# Patient Record
Sex: Male | Born: 1990 | Race: White | Hispanic: No | Marital: Single | Smoking: Current every day smoker
Health system: Southern US, Community
[De-identification: ages and names within clinical notes are randomized; demographics above are authoritative.]

## PROBLEM LIST (undated history)

## (undated) DIAGNOSIS — F419 Anxiety disorder, unspecified: Secondary | ICD-10-CM

## (undated) DIAGNOSIS — F329 Major depressive disorder, single episode, unspecified: Secondary | ICD-10-CM

## (undated) DIAGNOSIS — F32A Depression, unspecified: Secondary | ICD-10-CM

## (undated) DIAGNOSIS — N289 Disorder of kidney and ureter, unspecified: Secondary | ICD-10-CM

## (undated) DIAGNOSIS — Z8614 Personal history of Methicillin resistant Staphylococcus aureus infection: Secondary | ICD-10-CM

## (undated) DIAGNOSIS — N200 Calculus of kidney: Secondary | ICD-10-CM

## (undated) DIAGNOSIS — K219 Gastro-esophageal reflux disease without esophagitis: Secondary | ICD-10-CM

## (undated) HISTORY — DX: Anxiety disorder, unspecified: F41.9

## (undated) HISTORY — DX: Major depressive disorder, single episode, unspecified: F32.9

## (undated) HISTORY — PX: PILONIDAL CYST DRAINAGE: SHX743

## (undated) HISTORY — PX: OTHER SURGICAL HISTORY: SHX169

## (undated) HISTORY — DX: Depression, unspecified: F32.A

## (undated) HISTORY — DX: Calculus of kidney: N20.0

## (undated) SURGERY — Surgical Case
Anesthesia: *Unknown

---
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
## 2010-06-14 ENCOUNTER — Inpatient Hospital Stay: Payer: Self-pay | Admitting: Psychiatry

## 2011-05-26 ENCOUNTER — Emergency Department: Payer: Self-pay | Admitting: Emergency Medicine

## 2011-05-26 LAB — COMPREHENSIVE METABOLIC PANEL
Anion Gap: 7 (ref 7–16)
Calcium, Total: 8.8 mg/dL (ref 8.5–10.1)
Chloride: 108 mmol/L — ABNORMAL HIGH (ref 98–107)
Co2: 29 mmol/L (ref 21–32)
EGFR (African American): >60
Glucose: 94 mg/dL (ref 65–99)
Potassium: 4.1 mmol/L (ref 3.5–5.1)
SGOT(AST): 12 U/L — ABNORMAL LOW (ref 15–37)
Sodium: 144 mmol/L (ref 136–145)

## 2011-05-26 LAB — URINALYSIS, COMPLETE
RBC,UR: 3718 /HPF (ref 0–5)
Squamous Epithelial: NONE SEEN

## 2011-05-26 LAB — CBC
MCH: 30.1 pg (ref 26.0–34.0)
Platelet: 189 10*3/uL (ref 150–440)
RBC: 4.96 10*6/uL (ref 4.40–5.90)

## 2011-05-26 LAB — LIPASE, BLOOD: Lipase: 51 U/L — ABNORMAL LOW (ref 73–393)

## 2013-09-24 ENCOUNTER — Emergency Department: Payer: Self-pay | Admitting: Emergency Medicine

## 2013-09-24 LAB — COMPREHENSIVE METABOLIC PANEL
ALBUMIN: 4.2 g/dL (ref 3.4–5.0)
ALK PHOS: 60 U/L
ALT: 32 U/L (ref 12–78)
ANION GAP: 8 (ref 7–16)
AST: 16 U/L (ref 15–37)
BUN: 11 mg/dL (ref 7–18)
Bilirubin,Total: 0.4 mg/dL (ref 0.2–1.0)
CHLORIDE: 106 mmol/L (ref 98–107)
Calcium, Total: 9.3 mg/dL (ref 8.5–10.1)
Co2: 25 mmol/L (ref 21–32)
Creatinine: 0.84 mg/dL (ref 0.60–1.30)
EGFR (African American): >60
Glucose: 95 mg/dL (ref 65–99)
Osmolality: 277 (ref 275–301)
Potassium: 3.9 mmol/L (ref 3.5–5.1)
Sodium: 139 mmol/L (ref 136–145)
TOTAL PROTEIN: 7.9 g/dL (ref 6.4–8.2)

## 2013-09-24 LAB — CBC
HCT: 46.4 % (ref 40.0–52.0)
HGB: 15.5 g/dL (ref 13.0–18.0)
MCH: 28.8 pg (ref 26.0–34.0)
MCHC: 33.3 g/dL (ref 32.0–36.0)
MCV: 87 fL (ref 80–100)
Platelet: 245 10*3/uL (ref 150–440)
RBC: 5.36 10*6/uL (ref 4.40–5.90)
RDW: 12.6 % (ref 11.5–14.5)
WBC: 10.8 10*3/uL — ABNORMAL HIGH (ref 3.8–10.6)

## 2013-09-24 LAB — URINALYSIS, COMPLETE
Bacteria: NONE SEEN
Bilirubin,UR: NEGATIVE
Blood: NEGATIVE
Glucose,UR: NEGATIVE mg/dL (ref 0–75)
LEUKOCYTE ESTERASE: NEGATIVE
NITRITE: NEGATIVE
PH: 5 (ref 4.5–8.0)
PROTEIN: NEGATIVE
RBC,UR: NONE SEEN /HPF (ref 0–5)
SQUAMOUS EPITHELIAL: NONE SEEN
Specific Gravity: 1.026 (ref 1.003–1.030)

## 2015-10-28 ENCOUNTER — Emergency Department
Admission: EM | Admit: 2015-10-28 | Discharge: 2015-10-28 | Disposition: A | Discharge status: Home / Self Care | Payer: 59 | Attending: Emergency Medicine | Admitting: Emergency Medicine

## 2015-10-28 ENCOUNTER — Emergency Department: Payer: 59

## 2015-10-28 ENCOUNTER — Encounter: Payer: Self-pay | Admitting: *Deleted

## 2015-10-28 DIAGNOSIS — F17200 Nicotine dependence, unspecified, uncomplicated: Secondary | ICD-10-CM | POA: Insufficient documentation

## 2015-10-28 DIAGNOSIS — R109 Unspecified abdominal pain: Secondary | ICD-10-CM

## 2015-10-28 DIAGNOSIS — N200 Calculus of kidney: Secondary | ICD-10-CM | POA: Diagnosis not present

## 2015-10-28 DIAGNOSIS — R10A0 Flank pain, unspecified side: Secondary | ICD-10-CM

## 2015-10-28 LAB — URINALYSIS COMPLETE WITH MICROSCOPIC (ARMC ONLY)
BACTERIA UA: NONE SEEN
BILIRUBIN URINE: NEGATIVE
Glucose, UA: NEGATIVE mg/dL
LEUKOCYTES UA: NEGATIVE
Nitrite: NEGATIVE
PH: 6 (ref 5.0–8.0)
Protein, ur: 100 mg/dL — AB
Specific Gravity, Urine: 1.026 (ref 1.005–1.030)

## 2015-10-28 LAB — COMPREHENSIVE METABOLIC PANEL
ALBUMIN: 4.9 g/dL (ref 3.5–5.0)
ALK PHOS: 55 U/L (ref 38–126)
ALT: 17 U/L (ref 17–63)
ANION GAP: 9 (ref 5–15)
AST: 18 U/L (ref 15–41)
BUN: 15 mg/dL (ref 6–20)
CALCIUM: 10 mg/dL (ref 8.9–10.3)
CHLORIDE: 109 mmol/L (ref 101–111)
CO2: 22 mmol/L (ref 22–32)
CREATININE: 1.17 mg/dL (ref 0.61–1.24)
GFR calc Af Amer: >60 mL/min (ref >60)
GFR calc non Af Amer: >60 mL/min (ref >60)
GLUCOSE: 123 mg/dL — AB (ref 65–99)
Potassium: 4.1 mmol/L (ref 3.5–5.1)
SODIUM: 140 mmol/L (ref 135–145)
Total Bilirubin: 0.7 mg/dL (ref 0.3–1.2)
Total Protein: 7.8 g/dL (ref 6.5–8.1)

## 2015-10-28 LAB — CBC
HEMATOCRIT: 45.5 % (ref 40.0–52.0)
HEMOGLOBIN: 15.6 g/dL (ref 13.0–18.0)
MCH: 29 pg (ref 26.0–34.0)
MCHC: 34.3 g/dL (ref 32.0–36.0)
MCV: 84.5 fL (ref 80.0–100.0)
Platelets: 253 10*3/uL (ref 150–440)
RBC: 5.38 MIL/uL (ref 4.40–5.90)
RDW: 12.7 % (ref 11.5–14.5)
WBC: 12.7 10*3/uL — AB (ref 3.8–10.6)

## 2015-10-28 MED ORDER — SODIUM CHLORIDE 0.9 % IV BOLUS (SEPSIS)
1000.0000 mL | Freq: Once | INTRAVENOUS | Status: AC
  Administered 2015-10-28: 1000 mL via INTRAVENOUS

## 2015-10-28 MED ORDER — MORPHINE SULFATE (PF) 4 MG/ML IV SOLN
INTRAVENOUS | Status: AC
  Administered 2015-10-28: 4 mg via INTRAVENOUS
  Filled 2015-10-28: qty 1

## 2015-10-28 MED ORDER — SODIUM CHLORIDE 0.9 % IV SOLN: INTRAVENOUS | Status: DC

## 2015-10-28 MED ORDER — ONDANSETRON HCL 4 MG PO TABS: 4.0000 mg | ORAL_TABLET | Freq: Every day | ORAL | Status: DC | PRN

## 2015-10-28 MED ORDER — TAMSULOSIN HCL 0.4 MG PO CAPS: 0.4000 mg | ORAL_CAPSULE | Freq: Every day | ORAL | Status: DC

## 2015-10-28 MED ORDER — MORPHINE SULFATE (PF) 4 MG/ML IV SOLN
4.0000 mg | Freq: Once | INTRAVENOUS | Status: AC
  Administered 2015-10-28: 4 mg via INTRAVENOUS

## 2015-10-28 MED ORDER — NAPROXEN 500 MG PO TABS: 500.0000 mg | ORAL_TABLET | Freq: Two times a day (BID) | ORAL | Status: DC

## 2015-10-28 MED ORDER — KETOROLAC TROMETHAMINE 30 MG/ML IJ SOLN
30.0000 mg | Freq: Once | INTRAMUSCULAR | Status: AC
  Administered 2015-10-28: 30 mg via INTRAVENOUS

## 2015-10-28 MED ORDER — ONDANSETRON HCL 4 MG/2ML IJ SOLN
INTRAMUSCULAR | Status: AC
  Administered 2015-10-28: 4 mg via INTRAVENOUS
  Filled 2015-10-28: qty 2

## 2015-10-28 MED ORDER — ONDANSETRON HCL 4 MG/2ML IJ SOLN
4.0000 mg | Freq: Once | INTRAMUSCULAR | Status: AC
  Administered 2015-10-28: 4 mg via INTRAVENOUS

## 2015-10-28 MED ORDER — OXYCODONE-ACETAMINOPHEN 5-325 MG PO TABS
1.0000 | ORAL_TABLET | Freq: Four times a day (QID) | ORAL | Status: DC | PRN
Start: 2015-10-28 — End: 2015-11-15

## 2015-10-28 MED ORDER — CIPROFLOXACIN HCL 500 MG PO TABS: 500.0000 mg | ORAL_TABLET | Freq: Two times a day (BID) | ORAL | Status: DC

## 2015-10-28 MED ORDER — KETOROLAC TROMETHAMINE 30 MG/ML IJ SOLN
INTRAMUSCULAR | Status: AC
  Administered 2015-10-28: 30 mg via INTRAVENOUS
  Filled 2015-10-28: qty 1

## 2015-10-28 NOTE — Discharge Instructions (Signed)
Kidney Stones °Kidney stones (urolithiasis) are deposits that form inside your kidneys. The intense pain is caused by the stone moving through the urinary tract. When the stone moves, the ureter goes into spasm around the stone. The stone is usually passed in the urine.  °CAUSES  °· A disorder that makes certain neck glands produce too much parathyroid hormone (primary hyperparathyroidism). °· A buildup of uric acid crystals, similar to gout in your joints. °· Narrowing (stricture) of the ureter. °· A kidney obstruction present at birth (congenital obstruction). °· Previous surgery on the kidney or ureters. °· Numerous kidney infections. °SYMPTOMS  °· Feeling sick to your stomach (nauseous). °· Throwing up (vomiting). °· Blood in the urine (hematuria). °· Pain that usually spreads (radiates) to the groin. °· Frequency or urgency of urination. °DIAGNOSIS  °· Taking a history and physical exam. °· Blood or urine tests. °· CT scan. °· Occasionally, an examination of the inside of the urinary bladder (cystoscopy) is performed. °TREATMENT  °· Observation. °· Increasing your fluid intake. °· Extracorporeal shock wave lithotripsy--This is a noninvasive procedure that uses shock waves to break up kidney stones. °· Surgery may be needed if you have severe pain or persistent obstruction. There are various surgical procedures. Most of the procedures are performed with the use of small instruments. Only small incisions are needed to accommodate these instruments, so recovery time is minimized. °The size, location, and chemical composition are all important variables that will determine the proper choice of action for you. Talk to your health care provider to better understand your situation so that you will minimize the risk of injury to yourself and your kidney.  °HOME CARE INSTRUCTIONS  °· Drink enough water and fluids to keep your urine clear or pale yellow. This will help you to pass the stone or stone fragments. °· Strain  all urine through the provided strainer. Keep all particulate matter and stones for your health care provider to see. The stone causing the pain may be as small as a grain of salt. It is very important to use the strainer each and every time you pass your urine. The collection of your stone will allow your health care provider to analyze it and verify that a stone has actually passed. The stone analysis will often identify what you can do to reduce the incidence of recurrences. °· Only take over-the-counter or prescription medicines for pain, discomfort, or fever as directed by your health care provider. °· Keep all follow-up visits as told by your health care provider. This is important. °· Get follow-up X-rays if required. The absence of pain does not always mean that the stone has passed. It may have only stopped moving. If the urine remains completely obstructed, it can cause loss of kidney function or even complete destruction of the kidney. It is your responsibility to make sure X-rays and follow-ups are completed. Ultrasounds of the kidney can show blockages and the status of the kidney. Ultrasounds are not associated with any radiation and can be performed easily in a matter of minutes. °· Make changes to your daily diet as told by your health care provider. You may be told to: °¨ Limit the amount of salt that you eat. °¨ Eat 5 or more servings of fruits and vegetables each day. °¨ Limit the amount of meat, poultry, fish, and eggs that you eat. °· Collect a 24-hour urine sample as told by your health care provider. You may need to collect another urine sample every 6-12   months. °SEEK MEDICAL CARE IF: °· You experience pain that is progressive and unresponsive to any pain medicine you have been prescribed. °SEEK IMMEDIATE MEDICAL CARE IF:  °· Pain cannot be controlled with the prescribed medicine. °· You have a fever or shaking chills. °· The severity or intensity of pain increases over 18 hours and is not  relieved by pain medicine. °· You develop a new onset of abdominal pain. °· You feel faint or pass out. °· You are unable to urinate. °  °This information is not intended to replace advice given to you by your health care provider. Make sure you discuss any questions you have with your health care provider. °  °Document Released: 05/01/2005 Document Revised: 01/20/2015 Document Reviewed: 10/02/2012 °Elsevier Interactive Patient Education ©2016 [employer] ° °

## 2015-10-28 NOTE — ED Provider Notes (Addendum)
[facility] Emergency Department Provider Note  ____________________________________________    I have reviewed the triage vital signs and the nursing notes.   HISTORY  Chief Complaint Flank Pain    HPI Jordan Mcpherson is a 25 y.o. male who presents with complaints of right flank pain that started at approximate 7 AM. He reports the pain radiates towards his groin on the right. He has had nausea and has vomited twice. He does have a history of kidney stones and this feels similar. He denies fevers chills. No injury to the area. No diarrhea. Did not notice blood in his urine. No dysuria.     History reviewed. No pertinent past medical history.  There are no active problems to display for this patient.   History reviewed. No pertinent past surgical history.  Current Outpatient Rx  Name  Route  Sig  Dispense  Refill  . bismuth subsalicylate (PEPTO BISMOL) 262 MG chewable tablet   Oral   Chew 524 mg by mouth as needed for indigestion or diarrhea or loose stools.         Marland Kitchen ibuprofen (ADVIL,MOTRIN) 200 MG tablet   Oral   Take 400 mg by mouth every 6 (six) hours as needed for moderate pain.           Allergies Review of patient's allergies indicates no known allergies.  History reviewed. No pertinent family history.  Social History Social History  Substance Use Topics  . Smoking status: Current Every Day Smoker  . Smokeless tobacco: None  . Alcohol Use: None    Review of Systems  Constitutional: Negative for fever. Eyes: Negative for Blurry vision  Cardiovascular: Negative for chest pain Respiratory: Negative for shortness of breath. Gastrointestinal: As above Genitourinary: Negative for dysuria. Musculoskeletal: Negative for back pain. Skin: Negative for rash. Neurological: Negative for headache Psychiatric: no anxiety    ____________________________________________   PHYSICAL EXAM:  VITAL SIGNS: ED Triage Vitals   Enc Vitals Group     BP 10/28/15 0939 133/93 mmHg     Pulse Rate 10/28/15 0939 50     Resp 10/28/15 0939 18     Temp --      Temp src --      SpO2 10/28/15 0939 100 %     Weight 10/28/15 0939 225 lb (102.059 kg)     Height 10/28/15 0939  (1.803 m)     Head Cir --      Peak Flow --      Pain Score 10/28/15 0940 10     Pain Loc --      Pain Edu? --    Constitutional: Alert and oriented. Uncomfortable but no acute distress Eyes: Conjunctivae are normal. No erythema or injection ENT   Head: Normocephalic and atraumatic.   Mouth/Throat: Mucous membranes are moist. Cardiovascular: Normal rate, regular rhythm. Respiratory: Normal respiratory effort without tachypnea nor retractions. Breath sounds are clear and equal bilaterally.  Gastrointestinal: Soft and non-tender in all quadrants. No distention. There is no CVA tenderness. Genitourinary: deferred Musculoskeletal: Nontender with normal range of motion in all extremities. No lower extremity tenderness nor edema. Neurologic:  Normal speech and language. No gross focal neurologic deficits are appreciated. Skin:  Skin is warm, dry and intact. No rash noted. Psychiatric: Mood and affect are normal. Patient exhibits appropriate insight and judgment.  ____________________________________________    LABS (pertinent positives/negatives)  Labs Reviewed  CBC - Abnormal; Notable for the following:    WBC 12.7 (*)  All other components within normal limits  COMPREHENSIVE METABOLIC PANEL - Abnormal; Notable for the following:    Glucose, Bld 123 (*)    All other components within normal limits  URINALYSIS COMPLETEWITH MICROSCOPIC ([facility] ONLY)    ____________________________________________   EKG  None  ____________________________________________    RADIOLOGY  CT renal stone study shows right proximal 4 mm stone  ____________________________________________   PROCEDURES  Procedure(s) performed:  none  Critical Care performed: none  ____________________________________________   INITIAL IMPRESSION / ASSESSMENT AND PLAN / ED COURSE  Pertinent labs & imaging results that were available during my care of the patient were reviewed by me and considered in my medical decision making (see chart for details).  Patient was presentation is most consistent with kidney stone. We'll treat with IV Toradol, IV morphine and IV Zofran give IV fluids and obtain CT renal stone study to evaluate further. Urinalysis pending  ----------------------------------------- 1:18 PM on 10/28/2015 -----------------------------------------  Discussed the case with urology [name] who reviewed the CT scan and urinalysis and feels the patient likely has inflammatory changes associated go home but does recommend starting the patient Cipro. Patient is non-toxic, well appearing, vitals normal. Strict return precautions disucssed ____________________________________________   FINAL CLINICAL IMPRESSION(S) / ED DIAGNOSES  Final diagnoses:  Flank pain  Kidney stone          [name], MD 10/28/15 1318  [name], MD 10/28/15 1321

## 2015-10-28 NOTE — ED Notes (Signed)
States right flank pain that began this AM, vomited twice, states hx of kidney stones

## 2015-10-28 NOTE — ED Notes (Addendum)
To CT Scan

## 2015-10-28 NOTE — ED Notes (Signed)
AAOx3.  Skin warm and dry.  Ambulates with easy and steady gait. NAD 

## 2015-10-31 ENCOUNTER — Encounter: Payer: Self-pay | Admitting: Emergency Medicine

## 2015-10-31 ENCOUNTER — Emergency Department: Payer: 59

## 2015-10-31 ENCOUNTER — Emergency Department
Admission: EM | Admit: 2015-10-31 | Discharge: 2015-11-01 | Disposition: A | Discharge status: Home / Self Care | Payer: 59 | Attending: Emergency Medicine | Admitting: Emergency Medicine

## 2015-10-31 DIAGNOSIS — F17200 Nicotine dependence, unspecified, uncomplicated: Secondary | ICD-10-CM | POA: Diagnosis not present

## 2015-10-31 DIAGNOSIS — Z791 Long term (current) use of non-steroidal anti-inflammatories (NSAID): Secondary | ICD-10-CM | POA: Diagnosis not present

## 2015-10-31 DIAGNOSIS — N23 Unspecified renal colic: Secondary | ICD-10-CM | POA: Insufficient documentation

## 2015-10-31 DIAGNOSIS — R109 Unspecified abdominal pain: Secondary | ICD-10-CM | POA: Diagnosis present

## 2015-10-31 HISTORY — DX: Disorder of kidney and ureter, unspecified: N28.9

## 2015-10-31 LAB — URINALYSIS COMPLETE WITH MICROSCOPIC (ARMC ONLY)
BACTERIA UA: NONE SEEN
Bilirubin Urine: NEGATIVE
GLUCOSE, UA: NEGATIVE mg/dL
KETONES UR: NEGATIVE mg/dL
LEUKOCYTES UA: NEGATIVE
NITRITE: NEGATIVE
Protein, ur: NEGATIVE mg/dL
SPECIFIC GRAVITY, URINE: 1.009 (ref 1.005–1.030)
pH: 7 (ref 5.0–8.0)

## 2015-10-31 LAB — BASIC METABOLIC PANEL
Anion gap: 7 (ref 5–15)
BUN: 13 mg/dL (ref 6–20)
CALCIUM: 8.7 mg/dL — AB (ref 8.9–10.3)
CO2: 23 mmol/L (ref 22–32)
CREATININE: 1.47 mg/dL — AB (ref 0.61–1.24)
Chloride: 108 mmol/L (ref 101–111)
Glucose, Bld: 103 mg/dL — ABNORMAL HIGH (ref 65–99)
Potassium: 3.8 mmol/L (ref 3.5–5.1)
SODIUM: 138 mmol/L (ref 135–145)

## 2015-10-31 LAB — CBC
HCT: 40 % (ref 40.0–52.0)
Hemoglobin: 13.6 g/dL (ref 13.0–18.0)
MCH: 28.9 pg (ref 26.0–34.0)
MCHC: 33.9 g/dL (ref 32.0–36.0)
MCV: 85.1 fL (ref 80.0–100.0)
PLATELETS: 200 10*3/uL (ref 150–440)
RBC: 4.7 MIL/uL (ref 4.40–5.90)
RDW: 12.4 % (ref 11.5–14.5)
WBC: 11.8 10*3/uL — AB (ref 3.8–10.6)

## 2015-10-31 MED ORDER — SODIUM CHLORIDE 0.9 % IV SOLN
Freq: Once | INTRAVENOUS | Status: AC
  Administered 2015-10-31: 23:00:00 via INTRAVENOUS

## 2015-10-31 MED ORDER — HYDROMORPHONE HCL 2 MG PO TABS: 2.0000 mg | ORAL_TABLET | Freq: Two times a day (BID) | ORAL | Status: DC | PRN

## 2015-10-31 MED ORDER — HYDROMORPHONE HCL 1 MG/ML IJ SOLN
1.0000 mg | Freq: Once | INTRAMUSCULAR | Status: AC
  Administered 2015-10-31: 1 mg via INTRAVENOUS
  Filled 2015-10-31: qty 1

## 2015-10-31 MED ORDER — FENTANYL CITRATE (PF) 100 MCG/2ML IJ SOLN
50.0000 ug | INTRAMUSCULAR | Status: DC | PRN
  Administered 2015-10-31: 50 ug via NASAL
  Filled 2015-10-31: qty 2

## 2015-10-31 MED ORDER — KETOROLAC TROMETHAMINE 30 MG/ML IJ SOLN
30.0000 mg | Freq: Once | INTRAMUSCULAR | Status: AC
  Administered 2015-10-31: 30 mg via INTRAVENOUS
  Filled 2015-10-31: qty 1

## 2015-10-31 NOTE — ED Notes (Signed)
Pt states known right sided 4mm renal calculi. Pt states has had continued pain since diagnosis on Thursday. Pt states 'i can't sleep it hurts so bad." last dose of pain medication 6 hours pta. Pt states has had vomiting, last time this am at 1030. Pt appears in no acute distress.

## 2015-10-31 NOTE — Discharge Instructions (Signed)
Kidney Stones °Kidney stones (urolithiasis) are deposits that form inside your kidneys. The intense pain is caused by the stone moving through the urinary tract. When the stone moves, the ureter goes into spasm around the stone. The stone is usually passed in the urine.  °CAUSES  °· A disorder that makes certain neck glands produce too much parathyroid hormone (primary hyperparathyroidism). °· A buildup of uric acid crystals, similar to gout in your joints. °· Narrowing (stricture) of the ureter. °· A kidney obstruction present at birth (congenital obstruction). °· Previous surgery on the kidney or ureters. °· Numerous kidney infections. °SYMPTOMS  °· Feeling sick to your stomach (nauseous). °· Throwing up (vomiting). °· Blood in the urine (hematuria). °· Pain that usually spreads (radiates) to the groin. °· Frequency or urgency of urination. °DIAGNOSIS  °· Taking a history and physical exam. °· Blood or urine tests. °· CT scan. °· Occasionally, an examination of the inside of the urinary bladder (cystoscopy) is performed. °TREATMENT  °· Observation. °· Increasing your fluid intake. °· Extracorporeal shock wave lithotripsy--This is a noninvasive procedure that uses shock waves to break up kidney stones. °· Surgery may be needed if you have severe pain or persistent obstruction. There are various surgical procedures. Most of the procedures are performed with the use of small instruments. Only small incisions are needed to accommodate these instruments, so recovery time is minimized. °The size, location, and chemical composition are all important variables that will determine the proper choice of action for you. Talk to your health care provider to better understand your situation so that you will minimize the risk of injury to yourself and your kidney.  °HOME CARE INSTRUCTIONS  °· Drink enough water and fluids to keep your urine clear or pale yellow. This will help you to pass the stone or stone fragments. °· Strain  all urine through the provided strainer. Keep all particulate matter and stones for your health care provider to see. The stone causing the pain may be as small as a grain of salt. It is very important to use the strainer each and every time you pass your urine. The collection of your stone will allow your health care provider to analyze it and verify that a stone has actually passed. The stone analysis will often identify what you can do to reduce the incidence of recurrences. °· Only take over-the-counter or prescription medicines for pain, discomfort, or fever as directed by your health care provider. °· Keep all follow-up visits as told by your health care provider. This is important. °· Get follow-up X-rays if required. The absence of pain does not always mean that the stone has passed. It may have only stopped moving. If the urine remains completely obstructed, it can cause loss of kidney function or even complete destruction of the kidney. It is your responsibility to make sure X-rays and follow-ups are completed. Ultrasounds of the kidney can show blockages and the status of the kidney. Ultrasounds are not associated with any radiation and can be performed easily in a matter of minutes. °· Make changes to your daily diet as told by your health care provider. You may be told to: °¨ Limit the amount of salt that you eat. °¨ Eat 5 or more servings of fruits and vegetables each day. °¨ Limit the amount of meat, poultry, fish, and eggs that you eat. °· Collect a 24-hour urine sample as told by your health care provider. You may need to collect another urine sample every 6-12   months. °SEEK MEDICAL CARE IF: °· You experience pain that is progressive and unresponsive to any pain medicine you have been prescribed. °SEEK IMMEDIATE MEDICAL CARE IF:  °· Pain cannot be controlled with the prescribed medicine. °· You have a fever or shaking chills. °· The severity or intensity of pain increases over 18 hours and is not  relieved by pain medicine. °· You develop a new onset of abdominal pain. °· You feel faint or pass out. °· You are unable to urinate. °  °This information is not intended to replace advice given to you by your health care provider. Make sure you discuss any questions you have with your health care provider. °  °Document Released: 05/01/2005 Document Revised: 01/20/2015 Document Reviewed: 10/02/2012 °Elsevier Interactive Patient Education ©2016 [employer] ° °

## 2015-10-31 NOTE — ED Provider Notes (Signed)
[facility] Emergency Department Provider Note        Time seen: ----------------------------------------- 10:23 PM on 10/31/2015 -----------------------------------------    I have reviewed the triage vital signs and the nursing notes.   HISTORY  Chief Complaint Flank Pain    HPI Jordan Mcpherson is a 25 y.o. male who presents to ER for severe right flank pain. Patient states he was recently diagnosed with a 4 mm kidney stone on Thursday. Patient states he can't sleep because hurt so bad. His last medication for pain was 6 hours prior to arrival. He has had one episode of vomiting, denies nausea currently. He states he still has pain medicine at home.   Past Medical History  Diagnosis Date  . Renal disorder     There are no active problems to display for this patient.   History reviewed. No pertinent past surgical history.  Allergies Review of patient's allergies indicates no known allergies.  Social History Social History  Substance Use Topics  . Smoking status: Current Every Day Smoker  . Smokeless tobacco: Never Used  . Alcohol Use: No    Review of Systems Constitutional: Negative for fever. Eyes: Negative for visual changes. ENT: Negative for sore throat. Cardiovascular: Negative for chest pain. Respiratory: Negative for shortness of breath. Gastrointestinal: Positive for flank pain Genitourinary: Negative for dysuria. Musculoskeletal: Negative for back pain. Skin: Negative for rash. Neurological: Negative for headaches, focal weakness or numbness.  10-point ROS otherwise negative.  ____________________________________________   PHYSICAL EXAM:  VITAL SIGNS: ED Triage Vitals  Enc Vitals Group     BP 10/31/15 2026 143/82 mmHg     Pulse Rate 10/31/15 2026 61     Resp 10/31/15 2026 16     Temp 10/31/15 2026 98.1 F (36.7 C)     Temp Source 10/31/15 2026 Oral     SpO2 10/31/15 2026 100 %     Weight 10/31/15 2026  220 lb (99.791 kg)     Height 10/31/15 2026  (1.803 m)     Head Cir --      Peak Flow --      Pain Score 10/31/15 2027 9     Pain Loc --      Pain Edu? --      Excl. in GC? --     Constitutional: Alert and oriented. Mild distress Eyes: Conjunctivae are normal. PERRL. Normal extraocular movements. ENT   Head: Normocephalic and atraumatic.   Nose: No congestion/rhinnorhea.   Mouth/Throat: Mucous membranes are moist.   Neck: No stridor. Cardiovascular: Normal rate, regular rhythm. No murmurs, rubs, or gallops. Respiratory: Normal respiratory effort without tachypnea nor retractions. Breath sounds are clear and equal bilaterally. No wheezes/rales/rhonchi. Gastrointestinal: Right flank tenderness, no rebound or guarding. Normal bowel sounds. Musculoskeletal: Nontender with normal range of motion in all extremities. No lower extremity tenderness nor edema. Neurologic:  Normal speech and language. No gross focal neurologic deficits are appreciated.  Skin:  Skin is warm, dry and intact. No rash noted. Psychiatric: Mood and affect are normal. Speech and behavior are normal.  ___________________________________________  ED COURSE:  Pertinent labs & imaging results that were available during my care of the patient were reviewed by me and considered in my medical decision making (see chart for details). Patient is in mild distress from pain, we will give IV Dilaudid, Zofran, Toradol and reevaluate. ____________________________________________    LABS (pertinent positives/negatives)  Labs Reviewed  URINALYSIS COMPLETEWITH MICROSCOPIC ([facility] ONLY) - Abnormal; Notable for  the following:    Color, Urine STRAW (*)    APPearance HAZY (*)    Hgb urine dipstick 3+ (*)    Squamous Epithelial / LPF 0-5 (*)    All other components within normal limits  CBC - Abnormal; Notable for the following:    WBC 11.8 (*)    All other components within normal limits  BASIC METABOLIC PANEL  - Abnormal; Notable for the following:    Glucose, Bld 103 (*)    Creatinine, Ser 1.47 (*)    Calcium 8.7 (*)    All other components within normal limits    RADIOLOGY Images were viewed by me  KUB Reveals proximal ureteral stone ____________________________________________  FINAL ASSESSMENT AND PLAN  Renal colic  Plan: Patient with labs and imaging as dictated above. Patient with recurrent renal colic. I will increase his pain medicine for home. He has had a slight increase in his creatinine, he was given IV fluids and encouraged to increase his liquid intake. He had artery been referred to urology for outpatient follow-up. I have offered him admission, he would prefer to go home at this time. He is currently feeling better.   Emily FilbertWilliams, Jacilyn Sanpedro E, MD   Note: This dictation was prepared with Dragon dictation. Any transcriptional errors that result from this process are unintentional   Emily FilbertJonathan E Xiana Carns, MD 10/31/15 2340

## 2015-11-01 NOTE — ED Provider Notes (Signed)
-----------------------------------------   12:45 AM on 11/01/2015 -----------------------------------------  Patient is much improved after IV Dilaudid. He will call urology in the morning to establish appointment. Will proceed with discharge per Dr. Mayford KnifeWilliams' plan. Return precautions given. Patient verbalizes understanding and agree with plan of care.  Irean HongJade J Johnathan Heskett, MD 11/02/15 [phone]

## 2015-11-03 ENCOUNTER — Encounter: Payer: Self-pay | Admitting: Urology

## 2015-11-03 ENCOUNTER — Ambulatory Visit (INDEPENDENT_AMBULATORY_CARE_PROVIDER_SITE_OTHER): Payer: 59 | Admitting: Urology

## 2015-11-03 VITALS — BP 127/82 | HR 73 | Ht 71.0 in | Wt 219.1 lb

## 2015-11-03 DIAGNOSIS — N132 Hydronephrosis with renal and ureteral calculous obstruction: Secondary | ICD-10-CM

## 2015-11-03 DIAGNOSIS — N200 Calculus of kidney: Secondary | ICD-10-CM | POA: Diagnosis not present

## 2015-11-03 DIAGNOSIS — R310 Gross hematuria: Secondary | ICD-10-CM | POA: Diagnosis not present

## 2015-11-03 DIAGNOSIS — N201 Calculus of ureter: Secondary | ICD-10-CM | POA: Diagnosis not present

## 2015-11-03 LAB — MICROSCOPIC EXAMINATION: BACTERIA UA: NONE SEEN

## 2015-11-03 LAB — URINALYSIS, COMPLETE
Bilirubin, UA: NEGATIVE
GLUCOSE, UA: NEGATIVE
Ketones, UA: NEGATIVE
NITRITE UA: NEGATIVE
PROTEIN UA: NEGATIVE
Specific Gravity, UA: >1.03 — ABNORMAL HIGH (ref 1.005–1.030)
Urobilinogen, Ur: 1 mg/dL (ref 0.2–1.0)
pH, UA: 6 (ref 5.0–7.5)

## 2015-11-03 MED ORDER — TAMSULOSIN HCL 0.4 MG PO CAPS: 0.4000 mg | ORAL_CAPSULE | Freq: Every day | ORAL | Status: DC

## 2015-11-03 MED ORDER — HYDROMORPHONE HCL 2 MG PO TABS: 2.0000 mg | ORAL_TABLET | Freq: Four times a day (QID) | ORAL | Status: DC | PRN

## 2015-11-03 MED ORDER — HYDROMORPHONE HCL 2 MG PO TABS: 2.0000 mg | ORAL_TABLET | Freq: Two times a day (BID) | ORAL | Status: DC | PRN

## 2015-11-03 NOTE — Progress Notes (Signed)
  11/03/2015 1:15 PM   Jordan Mcpherson 11/11/1990 [id number]  Referring provider: No referring provider defined for this encounter.  Chief Complaint  Patient presents with  . Nephrolithiasis    referred by ER    HPI: Patient is a 25-year-old Caucasian male who is referred to us from [facility] for a right ureteral stone causing obstruction and pain.   Patient states that 1 week ago he had the sudden onset of right-sided flank pain that radiated to the right waist. The pain was so intense he started to vomit. He went to the emergency room for further evaluation and management.  In the emergency room,  he received IV analgesics and IV antiemetics.     CT renal stone study performed on 10/28/2015 noted there is mild right hydronephrosis and proximal right hydroureter. Mild stranding of surrounding fat proximal right ureter.  There is 4 mm calcified obstructive calculus in proximal right ureter at the level of L2-L3 disc space.  Bilateral renal nonobstructive nephrolithiasis.  No small bowel obstruction.  No pericecal inflammation. Normal appendix. No calcified calculi are noted within under distended urinary bladder.  I personally reviewed the films with the patient.  He states the pain was manageable until 4 days later where he return to the emergency room for further pain management.  KUB taken at that time noted the right ureteral stone.  Patient is currently taking tamsulosin 0.4 mg daily, naproxen 500 mg when necessary, Dilaudid 2 mg every 12 hours, Zofran ODT T4 milligram and Cipro 500 mg twice daily.  He still has Percocet from the previous ED visit.    Patient has been taking naproxen during the day as he is still trying to continue to work. The last time he took naproxen was this morning.  He is taking narcotic pain medicine sparingly as it causes him drowsiness.  Today, he is experiencing urinary frequency, nocturia, intermittency, hesitancy,  straining to urinate, gross hematuria and a weak urinary stream.  The pain is located in the right flank radiating to the right waist. He states the pain is 10/10.  He has not had any further nausea or vomiting. He denies any fevers and chills.  He does have a prior history of nephrolithiasis, passing 2 stones spontaneously in the past. He has not seen a urologist. His stone composition is unknown.  His creatinine is 1.47 up from 1.17 6 days ago.  His UA today is positive for greater than 30 RBC's per high-power field and 11-30 WBC's per high-power field.   PMH: Past Medical History  Diagnosis Date  . Renal disorder   . Kidney stones   . Anxiety   . Depression     Surgical History: Past Surgical History  Procedure Laterality Date  . None      Home Medications:    Medication List       This list is accurate as of: 11/03/15  1:15 PM.  Always use your most recent med list.               bismuth subsalicylate 262 MG chewable tablet  Commonly known as:  PEPTO BISMOL  Chew 524 mg by mouth as needed for indigestion or diarrhea or loose stools. Reported on 11/03/2015     ciprofloxacin 500 MG tablet  Commonly known as:  CIPRO  Take 1 tablet (500 mg total) by mouth 2 (two) times daily.     HYDROmorphone 2 MG tablet  Commonly known as:  DILAUDID    Take 1 tablet (2 mg total) by mouth every 6 (six) hours as needed for severe pain.     ibuprofen 200 MG tablet  Commonly known as:  ADVIL,MOTRIN  Take 400 mg by mouth every 6 (six) hours as needed for moderate pain.     naproxen 500 MG tablet  Commonly known as:  NAPROSYN  Take 1 tablet (500 mg total) by mouth 2 (two) times daily with a meal.     ondansetron 4 MG tablet  Commonly known as:  ZOFRAN  Take 1 tablet (4 mg total) by mouth daily as needed for nausea or vomiting.     oxyCODONE-acetaminophen 5-325 MG tablet  Commonly known as:  ROXICET  Take 1 tablet by mouth every 6 (six) hours as needed.     tamsulosin 0.4 MG Caps  capsule  Commonly known as:  FLOMAX  Take 1 capsule (0.4 mg total) by mouth daily.        Allergies: No Known Allergies  Family History: Family History  Problem Relation Age of Onset  . Kidney disease Neg Hx   . Kidney Stones Father   . Prostate cancer Maternal Uncle     Social History:  reports that he has been smoking.  He has never used smokeless tobacco. He reports that he does not drink alcohol or use illicit drugs.  ROS: UROLOGY Frequent Urination?: Yes Hard to postpone urination?: No Burning/pain with urination?: No Get up at night to urinate?: Yes Leakage of urine?: No Urine stream starts and stops?: Yes Trouble starting stream?: Yes Do you have to strain to urinate?: Yes Blood in urine?: Yes Urinary tract infection?: No Sexually transmitted disease?: No Injury to kidneys or bladder?: No Painful intercourse?: No Weak stream?: Yes Erection problems?: No Penile pain?: No  Gastrointestinal Nausea?: Yes Vomiting?: Yes Indigestion/heartburn?: No Diarrhea?: No Constipation?: Yes  Constitutional Fever: Yes Night sweats?: Yes Weight loss?: No Fatigue?: No  Skin Skin rash/lesions?: No Itching?: No  Eyes Blurred vision?: No Double vision?: No  Ears/Nose/Throat Sore throat?: No Sinus problems?: No  Hematologic/Lymphatic Swollen glands?: No Easy bruising?: No  Cardiovascular Leg swelling?: No Chest pain?: No  Respiratory Cough?: No Shortness of breath?: No  Endocrine Excessive thirst?: Yes  Musculoskeletal Back pain?: Yes Joint pain?: No  Neurological Headaches?: No Dizziness?: Yes  Psychologic Depression?: No Anxiety?: No  Physical Exam: BP 127/82 mmHg  Pulse 73  Ht 5' 11" (1.803 m)  Wt 219 lb 1.6 oz (99.383 kg)  BMI 30.57 kg/m2  Constitutional: Well nourished. Alert and oriented, No acute distress. HEENT: [facility] AT, moist mucus membranes. Trachea midline, no masses. Cardiovascular: No clubbing, cyanosis, or  edema. Respiratory: Normal respiratory effort, no increased work of breathing. GI: Abdomen is soft, non tender, non distended, no abdominal masses. Liver and spleen not palpable.  No hernias appreciated.  Stool sample for occult testing is not indicated.   GU: Right CVA tenderness.  No bladder fullness or masses.   Skin: No rashes, bruises or suspicious lesions. Lymph: No cervical or inguinal adenopathy. Neurologic: Grossly intact, no focal deficits, moving all 4 extremities. Psychiatric: Normal mood and affect.  Laboratory Data: Lab Results  Component Value Date   WBC 11.8* 10/31/2015   HGB 13.6 10/31/2015   HCT 40.0 10/31/2015   MCV 85.1 10/31/2015   PLT 200 10/31/2015    Lab Results  Component Value Date   CREATININE 1.47* 10/31/2015     Lab Results  Component Value Date   AST 18 10/28/2015     Lab Results  Component Value Date   ALT 17 10/28/2015     Urinalysis Results for orders placed or performed in visit on 11/03/15  Microscopic Examination  Result Value Ref Range   WBC, UA 11-30 (A) 0 -  5 /hpf   RBC, UA >30 (H) 0 -  2 /hpf   Epithelial Cells (non renal) 0-10 0 - 10 /hpf   Mucus, UA Present (A) Not Estab.   Bacteria, UA None seen None seen/Few  Urinalysis, Complete  Result Value Ref Range   Specific Gravity, UA >1.030 (H) 1.005 - 1.030   pH, UA 6.0 5.0 - 7.5   Color, UA Yellow Yellow   Appearance Ur Clear Clear   Leukocytes, UA Trace (A) Negative   Protein, UA Negative Negative/Trace   Glucose, UA Negative Negative   Ketones, UA Negative Negative   RBC, UA 3+ (A) Negative   Bilirubin, UA Negative Negative   Urobilinogen, Ur 1.0 0.2 - 1.0 mg/dL   Nitrite, UA Negative Negative   Microscopic Examination See below:     Pertinent Imaging: CLINICAL DATA: Right flank pain starting this morning, vomited twice, history of kidney stones  EXAM: CT ABDOMEN AND PELVIS WITHOUT CONTRAST  TECHNIQUE: Multidetector CT imaging of the abdomen and pelvis was  performed following the standard protocol without IV contrast.  COMPARISON: 05/26/2011  FINDINGS: Lower chest: Lung bases are unremarkable.  Hepatobiliary: Unenhanced liver shows no biliary ductal dilatation.  Pancreas: Unenhanced pancreas is unremarkable.  Spleen: Unenhanced spleen is unremarkable.  Adrenals/Urinary Tract: No adrenal gland mass is noted. There is mild right hydronephrosis and mild proximal right hydroureter. Punctate nonobstructive calcified calculus in upper pole of the right kidney measures 2 mm. Nonobstructive calcified calculus in midpole of the left kidney measures 8 mm.  Axial image 42 there is 4 mm calcified calculus in proximal right ureter at the level of L2-L3 disc space. Mild proximal right periureteral stranding. Bilateral distal ureter is unremarkable. No calcified calculi are noted within under distended urinary bladder.  Stomach/Bowel: No small bowel obstruction. No thickened or dilated small bowel loops. No pericecal inflammation. Normal appendix is noted in axial image 55. Moderate stool noted in proximal sigmoid colon. No distal colitis or diverticulitis. No distal colonic obstruction.  Vascular/Lymphatic: No aortic aneurysm. No retroperitoneal or mesenteric adenopathy.  Reproductive: No pelvic mass. Prostate gland and seminal vesicles are unremarkable.  Other: No ascites or free abdominal air. No inguinal adenopathy.  Musculoskeletal: No destructive bony lesions are noted. Alignment, disc spaces and vertebral body heights are preserved.  IMPRESSION: 1. There is mild right hydronephrosis and proximal right hydroureter. Mild stranding of surrounding fat proximal right ureter. 2. There is 4 mm calcified obstructive calculus in proximal right ureter at the level of L2-L3 disc space. 3. Bilateral renal nonobstructive nephrolithiasis. 4. No small bowel obstruction. 5. No pericecal inflammation. Normal appendix. 6. No  calcified calculi are noted within under distended urinary bladder.   Electronically Signed  By: Liviu Pop M.D.  On: 10/28/2015 10:35  CLINICAL DATA: 25-year-old male with flank pain.  EXAM: ABDOMEN - 1 VIEW  COMPARISON: CT dated 10/28/2015  FINDINGS: A 6 mm radiopaque focus along the right lateral aspect of the L4 vertebrae may correspond to the right UPJ calculus seen on the prior CT. A 7 mm radiopaque density over the left renal silhouette likely corresponds to the stone seen on the prior CT. There is no bowel obstruction. No free air. The osseous structures and soft tissues appear unremarkable.    IMPRESSION: A 6 mm radiopaque focus to the right of the L4 vertebrae may correspond to the previously seen right UPJ calculus. -   Electronically Signed  By: Arash Radparvar M.D.  On: 11/01/2015 00:16   Assessment & Plan:    Patient will undergo a right ureteroscopy with right laser lithotripsy with right ureteral stent placement for definitive treatment of his right ureteral stone.    1. Right ureteral stone:   We discussed definitive management for his right ureteral stone, such as an MET, ESWL and URS/LL/ureteral stent placement.  Patient stated that he could not go on with the pain as it is and he would like to pursue a surgical procedure.  I discussed the risks and success rate of both the ESWL and URS//ureteral stent placement. Patient will like to proceed with URS/LL/ureteral stent placement.   I explained to the patient how the procedure is performed and the risks involved.  I informed patient that he will have a stent placed during the procedure and will remain in place after the procedure for a short time.  It will be removed in the office with a cystoscope, unless a string in left in place.  I informed that patient that about 50% of patients who undergo ureteroscopy will have "stent pain," and this is by far the most common risk/complaint following  ureteroscopy.     They may be residual stones within the kidney or ureter may be present up to 40% of the time following ureteroscopy, depending on the original stone size and location. These stone fragments will be seen and addressed on follow-up imaging.  Injury to the ureter is the most common intra-operative complication during ureteroscopy. The reported risk of perforation ranges greatly, depending on whether it is defined as a complete perforation (0.1-0.7% - think of this as a hole through the entire ureter), a partial perforation (1.6% - a hole nearly through the entire ureter), or mucosal tear/scrape (5% - these are similar to a sore on the inside of the mouth). Almost 100% of these will heal with prolonged stenting (anywhere between 2 - 4 weeks). Should a large perforation occur, your urologist may chose to stop the procedure and return on another day when the ureter has had time to heal.  I also explained the risks of general anesthesia, such as: MI, CVA, paralysis, coma and/or death.  He should contact our office or seek treatment in the ED if he becomes febrile, pain is difficult to control or in the left flank in order to arrange for emergent/urgent intervention.  While he is waiting for surgery,  I have encouraged him to increase his fluid intake to 2.5 L daily, take the tamsulosin, not to wait until the pain becomes severe to take the pain medication and strain his urine in an effort to facilitate MET.     - Urinalysis, Complete - CULTURE, URINE COMPREHENSIVE  2. Renal stones:   Patient with a 8 mm non obstructing left renal stone and a small punctate stone in each kidney.  I stated that this stone will need to be addressed at a later date, unless he starts developing left flank pain.  At that time, he should contact our office or seek evaluation in the ED to see if that stone has migrated and causing obstruction.    3. Right hydronephrosis:   Patient was found to have right  hydronephrosis due to an ureteral.  A RUS will be obtained one month after he has completed definitive   treatment for his stone.      4. Gross hematuria:   We will continue to monitor the patient's UA after the treatment/passage of the stone to ensure the hematuria has resolved.  If hematuria persists, we will pursue a hematuria workup with CT Urogram and cystoscopy if appropriate.  Return for right URS/LL/right ureteral stent placement.  These notes generated with voice recognition software. I apologize for typographical errors.  Jaylani Mcguinn, PA-C  [facility] [address], [address] [place], [place] [address] [phone]   

## 2015-11-05 ENCOUNTER — Telehealth: Payer: Self-pay | Admitting: Radiology

## 2015-11-05 LAB — CULTURE, URINE COMPREHENSIVE

## 2015-11-05 NOTE — Telephone Encounter (Signed)
Notified pt's mother, Boneta LucksJenny, of surgery scheduled 11/15/15, pre-admit phone interview on 11/08/15 between 9am-1pm & to call Friday prior to surgery for arrival to [facility]. Boneta LucksJenny voices understanding.

## 2015-11-08 ENCOUNTER — Encounter: Payer: Self-pay | Admitting: *Deleted

## 2015-11-08 ENCOUNTER — Inpatient Hospital Stay: Admission: RE | Admit: 2015-11-08 | Payer: 59 | Source: Ambulatory Visit

## 2015-11-08 ENCOUNTER — Telehealth: Payer: Self-pay

## 2015-11-08 DIAGNOSIS — N1330 Unspecified hydronephrosis: Secondary | ICD-10-CM

## 2015-11-08 DIAGNOSIS — N200 Calculus of kidney: Secondary | ICD-10-CM

## 2015-11-08 NOTE — Telephone Encounter (Signed)
Patient's mother called stating that for the past 3 days patient has not had any pain and wants to see about checking if he has possibly passed his stone. Patient has not noticed passing anything but is not sure. Per Michiel CowboyShannon McGowan, PAC patient can go for an KUB and renal u/s to check on stone and hydronephrosis.

## 2015-11-08 NOTE — Patient Instructions (Signed)
  Your procedure is scheduled on: 11-15-15 [facility](MONDAY) Report to Same Day Surgery 2nd floor medical mall To find out your arrival time please call [phone] between 1PM - 3PM on 11-12-15 (FRIDAY)  Remember: Instructions that are not followed completely may result in serious medical risk, up to and including death, or upon the discretion of your surgeon and anesthesiologist your surgery may need to be rescheduled.    _x___ 1. Do not eat food or drink liquids after midnight. No gum chewing or hard candies.     __x__ 2. No Alcohol for 24 hours before or after surgery.   __x__3. No Smoking for 24 prior to surgery.   ____  4. Bring all medications with you on the day of surgery if instructed.    __x__ 5. Notify your doctor if there is any change in your medical condition     (cold, fever, infections).     Do not wear jewelry, make-up, hairpins, clips or nail polish.  Do not wear lotions, powders, or perfumes. You may wear deodorant.  Do not shave 48 hours prior to surgery. Men may shave face and neck.  Do not bring valuables to the hospital.    [facility] is not responsible for any belongings or valuables.               Contacts, dentures or bridgework may not be worn into surgery.  Leave your suitcase in the car. After surgery it may be brought to your room.  For patients admitted to the hospital, discharge time is determined by your treatment team.   Patients discharged the day of surgery will not be allowed to drive home.    Please read over the following fact sheets that you were given:   [facility] Preparing for Surgery and or MRSA Information   _x___ Take these medicines the morning of surgery with A SIP OF WATER:    1. FLOMAX  2. MAY TAKE OXYCODONE IF NEEDED AM OF SURGERY  3.  4.  5.  6.  ____ Fleet Enema (as directed)   ____ Use CHG Soap or sage wipes as directed on instruction sheet   ____ Use inhalers on the day of surgery and bring to hospital day of  surgery  ____ Stop metformin 2 days prior to surgery    ____ Take 1/2 of usual insulin dose the night before surgery and none on the morning of surgery.   ____ Stop aspirin or coumadin, or plavix  _x__ Stop Anti-inflammatories such as Advil, Aleve, Ibuprofen, Motrin, Naproxen,          Naprosyn, Goodies powders or aspirin products. Ok to take Tylenol.   ____ Stop supplements until after surgery.    ____ Bring C-Pap to the hospital.

## 2015-11-09 ENCOUNTER — Ambulatory Visit
Admission: RE | Admit: 2015-11-09 | Discharge: 2015-11-09 | Disposition: A | Discharge status: Home / Self Care | Payer: 59 | Source: Ambulatory Visit | Attending: Urology | Admitting: Urology

## 2015-11-09 ENCOUNTER — Encounter
Admission: RE | Admit: 2015-11-09 | Discharge: 2015-11-09 | Disposition: A | Discharge status: Home / Self Care | Payer: 59 | Source: Ambulatory Visit | Attending: Urology | Admitting: Urology

## 2015-11-09 DIAGNOSIS — N200 Calculus of kidney: Secondary | ICD-10-CM

## 2015-11-09 DIAGNOSIS — N1330 Unspecified hydronephrosis: Secondary | ICD-10-CM | POA: Diagnosis present

## 2015-11-09 DIAGNOSIS — N132 Hydronephrosis with renal and ureteral calculous obstruction: Secondary | ICD-10-CM | POA: Insufficient documentation

## 2015-11-09 LAB — SURGICAL PCR SCREEN
MRSA, PCR: NEGATIVE
STAPHYLOCOCCUS AUREUS: NEGATIVE

## 2015-11-09 NOTE — Telephone Encounter (Signed)
Patient notified of resultsSW 

## 2015-11-09 NOTE — Telephone Encounter (Signed)
-----   Message from Harle BattiestShannon A McGowan, PA-C sent at 11/09/2015  4:47 PM EDT ----- Patient's right stone is still present.  Proceed as planned with URS.

## 2015-11-15 ENCOUNTER — Telehealth: Payer: Self-pay | Admitting: Radiology

## 2015-11-15 ENCOUNTER — Encounter
Admission: RE | Disposition: A | Discharge status: Home / Self Care | Payer: Self-pay | Source: Ambulatory Visit | Attending: Urology

## 2015-11-15 ENCOUNTER — Ambulatory Visit
Admission: RE | Admit: 2015-11-15 | Discharge: 2015-11-15 | Disposition: A | Discharge status: Home / Self Care | Payer: 59 | Source: Ambulatory Visit | Attending: Urology | Admitting: Urology

## 2015-11-15 ENCOUNTER — Ambulatory Visit: Payer: 59 | Admitting: Anesthesiology

## 2015-11-15 ENCOUNTER — Encounter: Payer: Self-pay | Admitting: *Deleted

## 2015-11-15 DIAGNOSIS — Z79899 Other long term (current) drug therapy: Secondary | ICD-10-CM | POA: Diagnosis not present

## 2015-11-15 DIAGNOSIS — F17210 Nicotine dependence, cigarettes, uncomplicated: Secondary | ICD-10-CM | POA: Insufficient documentation

## 2015-11-15 DIAGNOSIS — N132 Hydronephrosis with renal and ureteral calculous obstruction: Secondary | ICD-10-CM | POA: Insufficient documentation

## 2015-11-15 DIAGNOSIS — K219 Gastro-esophageal reflux disease without esophagitis: Secondary | ICD-10-CM | POA: Insufficient documentation

## 2015-11-15 DIAGNOSIS — N201 Calculus of ureter: Secondary | ICD-10-CM | POA: Diagnosis not present

## 2015-11-15 DIAGNOSIS — F419 Anxiety disorder, unspecified: Secondary | ICD-10-CM | POA: Insufficient documentation

## 2015-11-15 DIAGNOSIS — R1031 Right lower quadrant pain: Secondary | ICD-10-CM | POA: Diagnosis not present

## 2015-11-15 DIAGNOSIS — F329 Major depressive disorder, single episode, unspecified: Secondary | ICD-10-CM | POA: Diagnosis not present

## 2015-11-15 DIAGNOSIS — N202 Calculus of kidney with calculus of ureter: Secondary | ICD-10-CM | POA: Diagnosis present

## 2015-11-15 HISTORY — PX: CYSTOSCOPY/URETEROSCOPY/HOLMIUM LASER/STENT PLACEMENT: SHX6546

## 2015-11-15 HISTORY — DX: Gastro-esophageal reflux disease without esophagitis: K21.9

## 2015-11-15 SURGERY — CYSTOSCOPY/URETEROSCOPY/HOLMIUM LASER/STENT PLACEMENT
Anesthesia: General | Laterality: Right | Wound class: Clean Contaminated

## 2015-11-15 MED ORDER — FAMOTIDINE 20 MG PO TABS
20.0000 mg | ORAL_TABLET | Freq: Once | ORAL | Status: AC
  Administered 2015-11-15: 20 mg via ORAL

## 2015-11-15 MED ORDER — OXYCODONE-ACETAMINOPHEN 5-325 MG PO TABS
ORAL_TABLET | ORAL | Status: AC
  Filled 2015-11-15: qty 1

## 2015-11-15 MED ORDER — OXYBUTYNIN CHLORIDE 5 MG PO TABS: 5.0000 mg | ORAL_TABLET | Freq: Three times a day (TID) | ORAL | Status: DC | PRN

## 2015-11-15 MED ORDER — FAMOTIDINE 20 MG PO TABS
ORAL_TABLET | ORAL | Status: AC
  Administered 2015-11-15: 20 mg via ORAL
  Filled 2015-11-15: qty 1

## 2015-11-15 MED ORDER — KETOROLAC TROMETHAMINE 30 MG/ML IJ SOLN
INTRAMUSCULAR | Status: DC | PRN
  Administered 2015-11-15: 30 mg via INTRAVENOUS

## 2015-11-15 MED ORDER — FENTANYL CITRATE (PF) 100 MCG/2ML IJ SOLN
INTRAMUSCULAR | Status: DC | PRN
  Administered 2015-11-15: 100 ug via INTRAVENOUS
  Administered 2015-11-15: 50 ug via INTRAVENOUS

## 2015-11-15 MED ORDER — ROCURONIUM BROMIDE 100 MG/10ML IV SOLN
INTRAVENOUS | Status: DC | PRN
  Administered 2015-11-15: 30 mg via INTRAVENOUS

## 2015-11-15 MED ORDER — MIDAZOLAM HCL 2 MG/2ML IJ SOLN
INTRAMUSCULAR | Status: DC | PRN
  Administered 2015-11-15: 2 mg via INTRAVENOUS

## 2015-11-15 MED ORDER — FENTANYL CITRATE (PF) 100 MCG/2ML IJ SOLN
25.0000 ug | INTRAMUSCULAR | Status: DC | PRN
  Administered 2015-11-15 (×4): 25 ug via INTRAVENOUS

## 2015-11-15 MED ORDER — PROPOFOL 10 MG/ML IV BOLUS
INTRAVENOUS | Status: DC | PRN
  Administered 2015-11-15: 200 mg via INTRAVENOUS

## 2015-11-15 MED ORDER — LACTATED RINGERS IV SOLN
INTRAVENOUS | Status: DC
  Administered 2015-11-15: 50 mL/h via INTRAVENOUS

## 2015-11-15 MED ORDER — IOTHALAMATE MEGLUMINE 43 % IV SOLN
INTRAVENOUS | Status: DC | PRN
  Administered 2015-11-15: 20 mL

## 2015-11-15 MED ORDER — CEFAZOLIN SODIUM-DEXTROSE 2-4 GM/100ML-% IV SOLN
INTRAVENOUS | Status: AC
  Administered 2015-11-15: 2 g via INTRAVENOUS
  Filled 2015-11-15: qty 100

## 2015-11-15 MED ORDER — OXYCODONE-ACETAMINOPHEN 5-325 MG PO TABS
1.0000 | ORAL_TABLET | Freq: Four times a day (QID) | ORAL | Status: DC | PRN
  Administered 2015-11-15: 1 via ORAL

## 2015-11-15 MED ORDER — SUGAMMADEX SODIUM 200 MG/2ML IV SOLN
INTRAVENOUS | Status: DC | PRN
  Administered 2015-11-15: 197.8 mg via INTRAVENOUS

## 2015-11-15 MED ORDER — ONDANSETRON HCL 4 MG/2ML IJ SOLN: 4.0000 mg | Freq: Once | INTRAMUSCULAR | Status: DC | PRN

## 2015-11-15 MED ORDER — ONDANSETRON HCL 4 MG/2ML IJ SOLN
INTRAMUSCULAR | Status: DC | PRN
  Administered 2015-11-15: 4 mg via INTRAVENOUS

## 2015-11-15 MED ORDER — OXYCODONE-ACETAMINOPHEN 5-325 MG PO TABS: 1.0000 | ORAL_TABLET | Freq: Four times a day (QID) | ORAL | Status: DC | PRN

## 2015-11-15 MED ORDER — CEFAZOLIN SODIUM-DEXTROSE 2-4 GM/100ML-% IV SOLN
2.0000 g | Freq: Once | INTRAVENOUS | Status: AC
  Administered 2015-11-15: 2 g via INTRAVENOUS

## 2015-11-15 MED ORDER — FENTANYL CITRATE (PF) 100 MCG/2ML IJ SOLN
INTRAMUSCULAR | Status: AC
  Administered 2015-11-15: 25 ug via INTRAVENOUS
  Filled 2015-11-15: qty 2

## 2015-11-15 SURGICAL SUPPLY — 29 items
ADAPTER SCOPE UROLOK II (MISCELLANEOUS) IMPLANT
BAG DRAIN CYSTO-URO LG1000N (MISCELLANEOUS) ×3 IMPLANT
BASKET ZERO TIP 1.9FR (BASKET) IMPLANT
CATH URETL 5X70 OPEN END (CATHETERS) ×3 IMPLANT
CNTNR SPEC 2.5X3XGRAD LEK (MISCELLANEOUS) ×1
CONRAY 43 FOR UROLOGY 50M (MISCELLANEOUS) ×3 IMPLANT
CONT SPEC 4OZ STER OR WHT (MISCELLANEOUS) ×2
CONTAINER SPEC 2.5X3XGRAD LEK (MISCELLANEOUS) ×1 IMPLANT
DRAPE UTILITY 15X26 TOWEL STRL (DRAPES) ×3 IMPLANT
GLOVE BIO SURGEON STRL SZ 6.5 (GLOVE) ×2 IMPLANT
GLOVE BIO SURGEONS STRL SZ 6.5 (GLOVE) ×1
GOWN STRL REUS W/ TWL LRG LVL3 (GOWN DISPOSABLE) ×2 IMPLANT
GOWN STRL REUS W/TWL LRG LVL3 (GOWN DISPOSABLE) ×4
GUIDEWIRE SUPER STIFF .035X180 (WIRE) ×3 IMPLANT
INTRODUCER DILATOR DOUBLE (INTRODUCER) ×3 IMPLANT
KIT RM TURNOVER CYSTO AR (KITS) ×3 IMPLANT
PACK CYSTO AR (MISCELLANEOUS) ×3 IMPLANT
PREP PVP WINGED SPONGE (MISCELLANEOUS) IMPLANT
PUMP SINGLE ACTION SAP (PUMP) IMPLANT
SENSORWIRE 0.038 NOT ANGLED (WIRE) ×6
SET CYSTO W/LG BORE CLAMP LF (SET/KITS/TRAYS/PACK) ×3 IMPLANT
SHEATH URETERAL 12FRX35CM (MISCELLANEOUS) IMPLANT
SOL .9 NS 3000ML IRR  AL (IV SOLUTION) ×2
SOL .9 NS 3000ML IRR UROMATIC (IV SOLUTION) ×1 IMPLANT
STENT URET 6FRX24 CONTOUR (STENTS) IMPLANT
STENT URET 6FRX26 CONTOUR (STENTS) ×3 IMPLANT
SURGILUBE 2OZ TUBE FLIPTOP (MISCELLANEOUS) ×3 IMPLANT
WATER STERILE IRR 1000ML POUR (IV SOLUTION) ×3 IMPLANT
WIRE SENSOR 0.038 NOT ANGLED (WIRE) ×2 IMPLANT

## 2015-11-15 NOTE — Transfer of Care (Signed)
Immediate Anesthesia Transfer of Care Note  Patient: Jordan SailorsChristopher R Mcpherson  Procedure(s) Performed: Procedure(s): CYSTOSCOPY/URETEROSCOPY/STENT PLACEMENT (Right)  Patient Location: PACU  Anesthesia Type:General  Level of Consciousness: patient cooperative and lethargic  Airway & Oxygen Therapy: Patient Spontanous Breathing and Patient connected to face mask oxygen  Post-op Assessment: Report given to RN and Post -op Vital signs reviewed and stable  Post vital signs: Reviewed and stable  Last Vitals:  Filed Vitals:   11/15/15 0929 11/15/15 1129  BP: 124/78 126/80  Pulse: 7 79  Temp: 36.5 C 36.8 C  Resp: 16 9    Last Pain: There were no vitals filed for this visit.    Patients Stated Pain Goal: 0 (11/15/15 0929)  Complications: No apparent anesthesia complications

## 2015-11-15 NOTE — Anesthesia Preprocedure Evaluation (Signed)
Anesthesia Evaluation  Patient identified by MRN, date of birth, ID band Patient awake    Reviewed: Allergy & Precautions, NPO status , Patient's Chart, lab work & pertinent test results  Airway Mallampati: II  TM Distance: >3 FB     Dental no notable dental hx.    Pulmonary Current Smoker,    Pulmonary exam normal        Cardiovascular negative cardio ROS Normal cardiovascular exam     Neuro/Psych Anxiety Depression negative neurological ROS     GI/Hepatic GERD  Medicated and Controlled,  Endo/Other  negative endocrine ROS  Renal/GU Renal diseasestones  negative genitourinary   Musculoskeletal negative musculoskeletal ROS (+)   Abdominal Normal abdominal exam  (+)   Peds negative pediatric ROS (+)  Hematology negative hematology ROS (+)   Anesthesia Other Findings   Reproductive/Obstetrics                             Anesthesia Physical Anesthesia Plan  ASA: II  Anesthesia Plan: General   Post-op Pain Management:    Induction: Intravenous  Airway Management Planned: Oral ETT  Additional Equipment:   Intra-op Plan:   Post-operative Plan: Extubation in OR  Informed Consent: I have reviewed the patients History and Physical, chart, labs and discussed the procedure including the risks, benefits and alternatives for the proposed anesthesia with the patient or authorized representative who has indicated his/her understanding and acceptance.   Dental advisory given  Plan Discussed with: CRNA and Surgeon  Anesthesia Plan Comments:         Anesthesia Quick Evaluation

## 2015-11-15 NOTE — Interval H&P Note (Signed)
History and Physical Interval Note:  11/15/2015 10:02 AM  Jordan Mcpherson  has presented today for surgery, with the diagnosis of URETERAL STONE,RENAL STONE,HYDRONEPHROSIS  The various methods of treatment have been discussed with the patient and family. After consideration of risks, benefits and other options for treatment, the patient has consented to  Procedure(s): CYSTOSCOPY/URETEROSCOPY/HOLMIUM LASER/STENT PLACEMENT (Right) as a surgical intervention .  The patient's history has been reviewed, patient examined, no change in status, stable for surgery.  I have reviewed the patient's chart and labs.  Questions were answered to the patient's satisfaction.    RRR CTAB  Vanna ScotlandAshley Ledford Goodson

## 2015-11-15 NOTE — Anesthesia Procedure Notes (Signed)
Procedure Name: Intubation Date/Time: 11/15/2015 10:29 AM Performed by: Omer JackWEATHERLY, Tremayne Sheldon Pre-anesthesia Checklist: Patient identified, Patient being monitored, Timeout performed, Emergency Drugs available and Suction available Patient Re-evaluated:Patient Re-evaluated prior to inductionOxygen Delivery Method: Circle system utilized Preoxygenation: Pre-oxygenation with 100% oxygen Intubation Type: IV induction Ventilation: Mask ventilation without difficulty Laryngoscope Size: Mac and 3 Grade View: Grade I Tube type: Oral Tube size: 7.5 mm Number of attempts: 1 Placement Confirmation: ETT inserted through vocal cords under direct vision,  positive ETCO2 and breath sounds checked- equal and bilateral Secured at: 21 cm Tube secured with: Tape Dental Injury: Teeth and Oropharynx as per pre-operative assessment

## 2015-11-15 NOTE — Op Note (Signed)
Date of procedure: 11/15/2015  Preoperative diagnosis:  1. Right ureteral stone 2. Flank pain   Postoperative diagnosis:  1. Same as above   Procedure: 1. Cystoscopy 2. Right retrograde pyelogram 3. Right ureteroscopy 4. Right ureteral stent placement  Surgeon: Jordan ScotlandAshley Stephen Baruch, MD  Anesthesia: General  Complications: None  Intraoperative findings: Ureteral narrowing at the level of the iliacs, able to pass 4.5 French semirigid ureteroscope beyond this area but not up to level of the stone. I'm able to pass flexible scope beyond this narrowed area therefore lithotripsy not possible. Stent placed today for planned staged procedure.  EBL: Minimal  Specimens: None  Drains: 6 x 26 French double-J ureteral stent on right  Indication: Jordan Mcpherson is a 25 y.o. patient with 8 mm right proximal ureteral stone which is failed to pass spontaneously.  After reviewing the management options for treatment, he elected to proceed with the above surgical procedure(s). We have discussed the potential benefits and risks of the procedure, side effects of the proposed treatment, the likelihood of the patient achieving the goals of the procedure, and any potential problems that might occur during the procedure or recuperation. Informed consent has been obtained.  Description of procedure:  The patient was taken to the operating room and general anesthesia was induced.  The patient was placed in the dorsal lithotomy position, prepped and draped in the usual sterile fashion, and preoperative antibiotics were administered. A preoperative time-out was performed.   At this point time, attention was turned to the right ureteral orifice after introducing a 21 French rigid cystoscope per urethra into the bladder. Scout imaging revealed a calcification within the proximal ureter at the site previously seen on CT scan, unchanged position. Retrograde pyelogram revealed a small filling defect in this area  with mild dilation of the proximal ureter and collecting system. A sensor wire was then placed up to level of the kidney without difficulty. A dual lumen catheter was used just within the distal ureter to introduce the Super Stiff wire as a working wire. I attempted to place 2 different flexible ureteroscopes up to level of the stone but was unsuccessful due to narrowing within the distal ureter but beyond the UO. Next, I used a 4.5 [place] semirigid ureteroscope the on this area which I was successfully able to traverse, however, it was relatively tight and I was not able to pass the scope all the way up to level of the stone therefore unable to treat the stone on this occasion. At this point, the decision was made to place a stent and return for a staged procedure. The Super Stiff wire was removed and the safety wire was backloaded over a rigid cystoscope. A 6 x 26 French double-J ureteral stent was advanced over the wire up to level of the kidney. The wire was partially drawn until full coil was noted within the renal pelvis. Wire was then fully withdrawn until full coil was noted within the bladder. The bladder was then drained. The patient was then repositioned the supine position, reversed from anesthesia, taken to the PACU in stable condition.  Plan: Case was discussed with the patient and his family. We'll plan to bring him back to the operating room in one week for repeat ureteroscopy after allowing for dilation of the ureter with the stent in place.  Jordan Mcpherson, M.D.

## 2015-11-15 NOTE — Telephone Encounter (Signed)
Notified pt's mother, Boneta LucksJenny, of surgery scheduled 11/22/15 & to call Friday prior to surgery for arrival time to [facility]. Advised that pt should follow instructions given in pre-admit phone interview. Boneta LucksJenny voices understanding.

## 2015-11-15 NOTE — H&P (View-Only) (Signed)
  11/03/2015 1:15 PM   Jordan Mcpherson 09/16/1990 [id number]  Referring provider: No referring provider defined for this encounter.  Chief Complaint  Patient presents with  . Nephrolithiasis    referred by ER    HPI: Patient is a 25-year-old Caucasian male who is referred to us from [facility] for a right ureteral stone causing obstruction and pain.   Patient states that 1 week ago he had the sudden onset of right-sided flank pain that radiated to the right waist. The pain was so intense he started to vomit. He went to the emergency room for further evaluation and management.  In the emergency room,  he received IV analgesics and IV antiemetics.     CT renal stone study performed on 10/28/2015 noted there is mild right hydronephrosis and proximal right hydroureter. Mild stranding of surrounding fat proximal right ureter.  There is 4 mm calcified obstructive calculus in proximal right ureter at the level of L2-L3 disc space.  Bilateral renal nonobstructive nephrolithiasis.  No small bowel obstruction.  No pericecal inflammation. Normal appendix. No calcified calculi are noted within under distended urinary bladder.  I personally reviewed the films with the patient.  He states the pain was manageable until 4 days later where he return to the emergency room for further pain management.  KUB taken at that time noted the right ureteral stone.  Patient is currently taking tamsulosin 0.4 mg daily, naproxen 500 mg when necessary, Dilaudid 2 mg every 12 hours, Zofran ODT T4 milligram and Cipro 500 mg twice daily.  He still has Percocet from the previous ED visit.    Patient has been taking naproxen during the day as he is still trying to continue to work. The last time he took naproxen was this morning.  He is taking narcotic pain medicine sparingly as it causes him drowsiness.  Today, he is experiencing urinary frequency, nocturia, intermittency, hesitancy,  straining to urinate, gross hematuria and a weak urinary stream.  The pain is located in the right flank radiating to the right waist. He states the pain is 10/10.  He has not had any further nausea or vomiting. He denies any fevers and chills.  He does have a prior history of nephrolithiasis, passing 2 stones spontaneously in the past. He has not seen a urologist. His stone composition is unknown.  His creatinine is 1.47 up from 1.17 6 days ago.  His UA today is positive for greater than 30 RBC's per high-power field and 11-30 WBC's per high-power field.   PMH: Past Medical History  Diagnosis Date  . Renal disorder   . Kidney stones   . Anxiety   . Depression     Surgical History: Past Surgical History  Procedure Laterality Date  . None      Home Medications:    Medication List       This list is accurate as of: 11/03/15  1:15 PM.  Always use your most recent med list.               bismuth subsalicylate 262 MG chewable tablet  Commonly known as:  PEPTO BISMOL  Chew 524 mg by mouth as needed for indigestion or diarrhea or loose stools. Reported on 11/03/2015     ciprofloxacin 500 MG tablet  Commonly known as:  CIPRO  Take 1 tablet (500 mg total) by mouth 2 (two) times daily.     HYDROmorphone 2 MG tablet  Commonly known as:  DILAUDID    Take 1 tablet (2 mg total) by mouth every 6 (six) hours as needed for severe pain.     ibuprofen 200 MG tablet  Commonly known as:  ADVIL,MOTRIN  Take 400 mg by mouth every 6 (six) hours as needed for moderate pain.     naproxen 500 MG tablet  Commonly known as:  NAPROSYN  Take 1 tablet (500 mg total) by mouth 2 (two) times daily with a meal.     ondansetron 4 MG tablet  Commonly known as:  ZOFRAN  Take 1 tablet (4 mg total) by mouth daily as needed for nausea or vomiting.     oxyCODONE-acetaminophen 5-325 MG tablet  Commonly known as:  ROXICET  Take 1 tablet by mouth every 6 (six) hours as needed.     tamsulosin 0.4 MG Caps  capsule  Commonly known as:  FLOMAX  Take 1 capsule (0.4 mg total) by mouth daily.        Allergies: No Known Allergies  Family History: Family History  Problem Relation Age of Onset  . Kidney disease Neg Hx   . Kidney Stones Father   . Prostate cancer Maternal Uncle     Social History:  reports that he has been smoking.  He has never used smokeless tobacco. He reports that he does not drink alcohol or use illicit drugs.  ROS: UROLOGY Frequent Urination?: Yes Hard to postpone urination?: No Burning/pain with urination?: No Get up at night to urinate?: Yes Leakage of urine?: No Urine stream starts and stops?: Yes Trouble starting stream?: Yes Do you have to strain to urinate?: Yes Blood in urine?: Yes Urinary tract infection?: No Sexually transmitted disease?: No Injury to kidneys or bladder?: No Painful intercourse?: No Weak stream?: Yes Erection problems?: No Penile pain?: No  Gastrointestinal Nausea?: Yes Vomiting?: Yes Indigestion/heartburn?: No Diarrhea?: No Constipation?: Yes  Constitutional Fever: Yes Night sweats?: Yes Weight loss?: No Fatigue?: No  Skin Skin rash/lesions?: No Itching?: No  Eyes Blurred vision?: No Double vision?: No  Ears/Nose/Throat Sore throat?: No Sinus problems?: No  Hematologic/Lymphatic Swollen glands?: No Easy bruising?: No  Cardiovascular Leg swelling?: No Chest pain?: No  Respiratory Cough?: No Shortness of breath?: No  Endocrine Excessive thirst?: Yes  Musculoskeletal Back pain?: Yes Joint pain?: No  Neurological Headaches?: No Dizziness?: Yes  Psychologic Depression?: No Anxiety?: No  Physical Exam: BP 127/82 mmHg  Pulse 73  Ht 5' 11" (1.803 m)  Wt 219 lb 1.6 oz (99.383 kg)  BMI 30.57 kg/m2  Constitutional: Well nourished. Alert and oriented, No acute distress. HEENT: [place] AT, moist mucus membranes. Trachea midline, no masses. Cardiovascular: No clubbing, cyanosis, or  edema. Respiratory: Normal respiratory effort, no increased work of breathing. GI: Abdomen is soft, non tender, non distended, no abdominal masses. Liver and spleen not palpable.  No hernias appreciated.  Stool sample for occult testing is not indicated.   GU: Right CVA tenderness.  No bladder fullness or masses.   Skin: No rashes, bruises or suspicious lesions. Lymph: No cervical or inguinal adenopathy. Neurologic: Grossly intact, no focal deficits, moving all 4 extremities. Psychiatric: Normal mood and affect.  Laboratory Data: Lab Results  Component Value Date   WBC 11.8* 10/31/2015   HGB 13.6 10/31/2015   HCT 40.0 10/31/2015   MCV 85.1 10/31/2015   PLT 200 10/31/2015    Lab Results  Component Value Date   CREATININE 1.47* 10/31/2015     Lab Results  Component Value Date   AST 18 10/28/2015     Lab Results  Component Value Date   ALT 17 10/28/2015     Urinalysis Results for orders placed or performed in visit on 11/03/15  Microscopic Examination  Result Value Ref Range   WBC, UA 11-30 (A) 0 -  5 /hpf   RBC, UA >30 (H) 0 -  2 /hpf   Epithelial Cells (non renal) 0-10 0 - 10 /hpf   Mucus, UA Present (A) Not Estab.   Bacteria, UA None seen None seen/Few  Urinalysis, Complete  Result Value Ref Range   Specific Gravity, UA >1.030 (H) 1.005 - 1.030   pH, UA 6.0 5.0 - 7.5   Color, UA Yellow Yellow   Appearance Ur Clear Clear   Leukocytes, UA Trace (A) Negative   Protein, UA Negative Negative/Trace   Glucose, UA Negative Negative   Ketones, UA Negative Negative   RBC, UA 3+ (A) Negative   Bilirubin, UA Negative Negative   Urobilinogen, Ur 1.0 0.2 - 1.0 mg/dL   Nitrite, UA Negative Negative   Microscopic Examination See below:     Pertinent Imaging: CLINICAL DATA: Right flank pain starting this morning, vomited twice, history of kidney stones  EXAM: CT ABDOMEN AND PELVIS WITHOUT CONTRAST  TECHNIQUE: Multidetector CT imaging of the abdomen and pelvis was  performed following the standard protocol without IV contrast.  COMPARISON: 05/26/2011  FINDINGS: Lower chest: Lung bases are unremarkable.  Hepatobiliary: Unenhanced liver shows no biliary ductal dilatation.  Pancreas: Unenhanced pancreas is unremarkable.  Spleen: Unenhanced spleen is unremarkable.  Adrenals/Urinary Tract: No adrenal gland mass is noted. There is mild right hydronephrosis and mild proximal right hydroureter. Punctate nonobstructive calcified calculus in upper pole of the right kidney measures 2 mm. Nonobstructive calcified calculus in midpole of the left kidney measures 8 mm.  Axial image 42 there is 4 mm calcified calculus in proximal right ureter at the level of L2-L3 disc space. Mild proximal right periureteral stranding. Bilateral distal ureter is unremarkable. No calcified calculi are noted within under distended urinary bladder.  Stomach/Bowel: No small bowel obstruction. No thickened or dilated small bowel loops. No pericecal inflammation. Normal appendix is noted in axial image 55. Moderate stool noted in proximal sigmoid colon. No distal colitis or diverticulitis. No distal colonic obstruction.  Vascular/Lymphatic: No aortic aneurysm. No retroperitoneal or mesenteric adenopathy.  Reproductive: No pelvic mass. Prostate gland and seminal vesicles are unremarkable.  Other: No ascites or free abdominal air. No inguinal adenopathy.  Musculoskeletal: No destructive bony lesions are noted. Alignment, disc spaces and vertebral body heights are preserved.  IMPRESSION: 1. There is mild right hydronephrosis and proximal right hydroureter. Mild stranding of surrounding fat proximal right ureter. 2. There is 4 mm calcified obstructive calculus in proximal right ureter at the level of L2-L3 disc space. 3. Bilateral renal nonobstructive nephrolithiasis. 4. No small bowel obstruction. 5. No pericecal inflammation. Normal appendix. 6. No  calcified calculi are noted within under distended urinary bladder.   Electronically Signed  By: Liviu Pop M.D.  On: 10/28/2015 10:35  CLINICAL DATA: 25-year-old male with flank pain.  EXAM: ABDOMEN - 1 VIEW  COMPARISON: CT dated 10/28/2015  FINDINGS: A 6 mm radiopaque focus along the right lateral aspect of the L4 vertebrae may correspond to the right UPJ calculus seen on the prior CT. A 7 mm radiopaque density over the left renal silhouette likely corresponds to the stone seen on the prior CT. There is no bowel obstruction. No free air. The osseous structures and soft tissues appear unremarkable.    IMPRESSION: A 6 mm radiopaque focus to the right of the L4 vertebrae may correspond to the previously seen right UPJ calculus. -   Electronically Signed  By: Arash Radparvar M.D.  On: 11/01/2015 00:16   Assessment & Plan:    Patient will undergo a right ureteroscopy with right laser lithotripsy with right ureteral stent placement for definitive treatment of his right ureteral stone.    1. Right ureteral stone:   We discussed definitive management for his right ureteral stone, such as an MET, ESWL and URS/LL/ureteral stent placement.  Patient stated that he could not go on with the pain as it is and he would like to pursue a surgical procedure.  I discussed the risks and success rate of both the ESWL and URS//ureteral stent placement. Patient will like to proceed with URS/LL/ureteral stent placement.   I explained to the patient how the procedure is performed and the risks involved.  I informed patient that he will have a stent placed during the procedure and will remain in place after the procedure for a short time.  It will be removed in the office with a cystoscope, unless a string in left in place.  I informed that patient that about 50% of patients who undergo ureteroscopy will have "stent pain," and this is by far the most common risk/complaint following  ureteroscopy.     They may be residual stones within the kidney or ureter may be present up to 40% of the time following ureteroscopy, depending on the original stone size and location. These stone fragments will be seen and addressed on follow-up imaging.  Injury to the ureter is the most common intra-operative complication during ureteroscopy. The reported risk of perforation ranges greatly, depending on whether it is defined as a complete perforation (0.1-0.7% - think of this as a hole through the entire ureter), a partial perforation (1.6% - a hole nearly through the entire ureter), or mucosal tear/scrape (5% - these are similar to a sore on the inside of the mouth). Almost 100% of these will heal with prolonged stenting (anywhere between 2 - 4 weeks). Should a large perforation occur, your urologist may chose to stop the procedure and return on another day when the ureter has had time to heal.  I also explained the risks of general anesthesia, such as: MI, CVA, paralysis, coma and/or death.  He should contact our office or seek treatment in the ED if he becomes febrile, pain is difficult to control or in the left flank in order to arrange for emergent/urgent intervention.  While he is waiting for surgery,  I have encouraged him to increase his fluid intake to 2.5 L daily, take the tamsulosin, not to wait until the pain becomes severe to take the pain medication and strain his urine in an effort to facilitate MET.     - Urinalysis, Complete - CULTURE, URINE COMPREHENSIVE  2. Renal stones:   Patient with a 8 mm non obstructing left renal stone and a small punctate stone in each kidney.  I stated that this stone will need to be addressed at a later date, unless he starts developing left flank pain.  At that time, he should contact our office or seek evaluation in the ED to see if that stone has migrated and causing obstruction.    3. Right hydronephrosis:   Patient was found to have right  hydronephrosis due to an ureteral.  A RUS will be obtained one month after he has completed definitive   treatment for his stone.      4. Gross hematuria:   We will continue to monitor the patient's UA after the treatment/passage of the stone to ensure the hematuria has resolved.  If hematuria persists, we will pursue a hematuria workup with CT Urogram and cystoscopy if appropriate.  Return for right URS/LL/right ureteral stent placement.  These notes generated with voice recognition software. I apologize for typographical errors.  Jeri Rawlins, PA-C  [facility] [address], [address] [place],  [address] [phone]   

## 2015-11-15 NOTE — Discharge Instructions (Signed)
You have a ureteral stent in place.  This is a tube that extends from your kidney to your bladder.  This may cause urinary bleeding, burning with urination, and urinary frequency.  Please call our office or present to the ED if you develop fevers >101 or pain which is not able to be controlled with oral pain medications.  You may be given either Flomax and/ or ditropan to help with bladder spasms and stent pain in addition to pain medications.   ° °[facility] °[address], [address] °[place], [place] [address] °[phone] ° ° ° °AMBULATORY SURGERY  °DISCHARGE INSTRUCTIONS ° ° °1) The drugs that you were given will stay in your system until tomorrow so for the next 24 hours you should not: ° °A) Drive an automobile °B) Make any legal decisions °C) Drink any alcoholic beverage ° ° °2) You may resume regular meals tomorrow.  Today it is better to start with liquids and gradually work up to solid foods. ° °You may eat anything you prefer, but it is better to start with liquids, then soup and crackers, and gradually work up to solid foods. ° ° °3) Please notify your doctor immediately if you have any unusual bleeding, trouble breathing, redness and pain at the surgery site, drainage, fever, or pain not relieved by medication. ° ° ° °4) Additional Instructions: ° ° ° ° ° ° ° °Please contact your physician with any problems or Same Day Surgery at [phone], Monday through Friday 6 am to 4 pm, or [place] [facility] [place] Main number at [phone]. °

## 2015-11-17 NOTE — Anesthesia Postprocedure Evaluation (Signed)
Anesthesia Post Note  Patient: Trey SailorsChristopher R Kirkendoll  Procedure(s) Performed: Procedure(s) (LRB): CYSTOSCOPY/URETEROSCOPY/STENT PLACEMENT (Right)  Patient location during evaluation: PACU Anesthesia Type: General Level of consciousness: awake and alert and oriented Pain management: pain level controlled Vital Signs Assessment: post-procedure vital signs reviewed and stable Respiratory status: spontaneous breathing Cardiovascular status: blood pressure returned to baseline Anesthetic complications: no    Last Vitals:  Filed Vitals:   11/15/15 1224 11/15/15 1311  BP: 128/83 120/77  Pulse: 66 64  Temp: 36.8 C   Resp: 16     Last Pain:  Filed Vitals:   11/17/15 0809  PainSc: 0-No pain                 Tifanny Dollens

## 2015-11-19 ENCOUNTER — Encounter: Payer: Self-pay | Admitting: *Deleted

## 2015-11-22 ENCOUNTER — Ambulatory Visit: Payer: 59 | Admitting: Anesthesiology

## 2015-11-22 ENCOUNTER — Ambulatory Visit
Admission: RE | Admit: 2015-11-22 | Discharge: 2015-11-22 | Disposition: A | Discharge status: Home / Self Care | Payer: 59 | Source: Ambulatory Visit | Attending: Urology | Admitting: Urology

## 2015-11-22 ENCOUNTER — Encounter: Payer: Self-pay | Admitting: Urology

## 2015-11-22 ENCOUNTER — Encounter: Payer: Self-pay | Admitting: *Deleted

## 2015-11-22 ENCOUNTER — Encounter
Admission: RE | Disposition: A | Discharge status: Home / Self Care | Payer: Self-pay | Source: Ambulatory Visit | Attending: Urology

## 2015-11-22 DIAGNOSIS — F17210 Nicotine dependence, cigarettes, uncomplicated: Secondary | ICD-10-CM | POA: Insufficient documentation

## 2015-11-22 DIAGNOSIS — N200 Calculus of kidney: Secondary | ICD-10-CM

## 2015-11-22 DIAGNOSIS — F419 Anxiety disorder, unspecified: Secondary | ICD-10-CM | POA: Diagnosis not present

## 2015-11-22 DIAGNOSIS — K219 Gastro-esophageal reflux disease without esophagitis: Secondary | ICD-10-CM | POA: Insufficient documentation

## 2015-11-22 DIAGNOSIS — N132 Hydronephrosis with renal and ureteral calculous obstruction: Secondary | ICD-10-CM | POA: Insufficient documentation

## 2015-11-22 DIAGNOSIS — F329 Major depressive disorder, single episode, unspecified: Secondary | ICD-10-CM | POA: Diagnosis not present

## 2015-11-22 DIAGNOSIS — N201 Calculus of ureter: Secondary | ICD-10-CM | POA: Diagnosis not present

## 2015-11-22 DIAGNOSIS — N202 Calculus of kidney with calculus of ureter: Secondary | ICD-10-CM | POA: Diagnosis present

## 2015-11-22 HISTORY — PX: CYSTOSCOPY/URETEROSCOPY/HOLMIUM LASER/STENT PLACEMENT: SHX6546

## 2015-11-22 HISTORY — DX: Personal history of Methicillin resistant Staphylococcus aureus infection: Z86.14

## 2015-11-22 SURGERY — CYSTOSCOPY/URETEROSCOPY/HOLMIUM LASER/STENT PLACEMENT
Anesthesia: General | Site: Ureter | Laterality: Right | Wound class: Clean Contaminated

## 2015-11-22 MED ORDER — LACTATED RINGERS IV SOLN
INTRAVENOUS | Status: DC
  Administered 2015-11-22: 10:00:00 via INTRAVENOUS

## 2015-11-22 MED ORDER — CEFAZOLIN SODIUM-DEXTROSE 2-4 GM/100ML-% IV SOLN
INTRAVENOUS | Status: AC
  Filled 2015-11-22: qty 100

## 2015-11-22 MED ORDER — ONDANSETRON HCL 4 MG/2ML IJ SOLN
INTRAMUSCULAR | Status: DC | PRN
  Administered 2015-11-22: 4 mg via INTRAVENOUS

## 2015-11-22 MED ORDER — IOTHALAMATE MEGLUMINE 43 % IV SOLN
INTRAVENOUS | Status: DC | PRN
  Administered 2015-11-22: 5 mL via URETHRAL

## 2015-11-22 MED ORDER — DEXAMETHASONE SODIUM PHOSPHATE 10 MG/ML IJ SOLN
INTRAMUSCULAR | Status: DC | PRN
  Administered 2015-11-22: 4 mg via INTRAVENOUS

## 2015-11-22 MED ORDER — FAMOTIDINE 20 MG PO TABS
ORAL_TABLET | ORAL | Status: AC
  Filled 2015-11-22: qty 1

## 2015-11-22 MED ORDER — FENTANYL CITRATE (PF) 100 MCG/2ML IJ SOLN
25.0000 ug | INTRAMUSCULAR | Status: DC | PRN
  Administered 2015-11-22 (×4): 25 ug via INTRAVENOUS

## 2015-11-22 MED ORDER — GLYCOPYRROLATE 0.2 MG/ML IJ SOLN
INTRAMUSCULAR | Status: DC | PRN
  Administered 2015-11-22: .8 mg via INTRAVENOUS

## 2015-11-22 MED ORDER — OXYCODONE-ACETAMINOPHEN 5-325 MG PO TABS
ORAL_TABLET | ORAL | Status: AC
  Filled 2015-11-22: qty 1

## 2015-11-22 MED ORDER — OXYCODONE-ACETAMINOPHEN 5-325 MG PO TABS: 1.0000 | ORAL_TABLET | Freq: Four times a day (QID) | ORAL | Status: DC | PRN

## 2015-11-22 MED ORDER — ONDANSETRON HCL 4 MG/2ML IJ SOLN: 4.0000 mg | Freq: Once | INTRAMUSCULAR | Status: DC | PRN

## 2015-11-22 MED ORDER — FENTANYL CITRATE (PF) 100 MCG/2ML IJ SOLN
INTRAMUSCULAR | Status: AC
  Administered 2015-11-22: 25 ug via INTRAVENOUS
  Filled 2015-11-22: qty 2

## 2015-11-22 MED ORDER — CEFAZOLIN SODIUM-DEXTROSE 2-4 GM/100ML-% IV SOLN
2.0000 g | Freq: Once | INTRAVENOUS | Status: AC
  Administered 2015-11-22: 2 g via INTRAVENOUS

## 2015-11-22 MED ORDER — NEOSTIGMINE METHYLSULFATE 10 MG/10ML IV SOLN
INTRAVENOUS | Status: DC | PRN
  Administered 2015-11-22: 5 mg via INTRAVENOUS

## 2015-11-22 MED ORDER — ROCURONIUM BROMIDE 100 MG/10ML IV SOLN
INTRAVENOUS | Status: DC | PRN
  Administered 2015-11-22: 5 mg via INTRAVENOUS
  Administered 2015-11-22: 40 mg via INTRAVENOUS

## 2015-11-22 MED ORDER — FAMOTIDINE 20 MG PO TABS
20.0000 mg | ORAL_TABLET | Freq: Once | ORAL | Status: AC
  Administered 2015-11-22: 20 mg via ORAL

## 2015-11-22 MED ORDER — PROPOFOL 10 MG/ML IV BOLUS
INTRAVENOUS | Status: DC | PRN
  Administered 2015-11-22: 180 mg via INTRAVENOUS

## 2015-11-22 MED ORDER — LIDOCAINE HCL (CARDIAC) 20 MG/ML IV SOLN
INTRAVENOUS | Status: DC | PRN
  Administered 2015-11-22: 100 mg via INTRAVENOUS

## 2015-11-22 MED ORDER — FENTANYL CITRATE (PF) 100 MCG/2ML IJ SOLN
INTRAMUSCULAR | Status: DC | PRN
  Administered 2015-11-22: 100 ug via INTRAVENOUS

## 2015-11-22 MED ORDER — OXYCODONE-ACETAMINOPHEN 5-325 MG PO TABS
1.0000 | ORAL_TABLET | Freq: Four times a day (QID) | ORAL | Status: DC | PRN
  Administered 2015-11-22: 1 via ORAL

## 2015-11-22 MED ORDER — MIDAZOLAM HCL 2 MG/2ML IJ SOLN
INTRAMUSCULAR | Status: DC | PRN
  Administered 2015-11-22: 2 mg via INTRAVENOUS

## 2015-11-22 SURGICAL SUPPLY — 30 items
ADAPTER SCOPE UROLOK II (MISCELLANEOUS) IMPLANT
BAG DRAIN CYSTO-URO LG1000N (MISCELLANEOUS) ×3 IMPLANT
BASKET ZERO TIP 1.9FR (BASKET) IMPLANT
CATH URETL 5X70 OPEN END (CATHETERS) ×3 IMPLANT
CNTNR SPEC 2.5X3XGRAD LEK (MISCELLANEOUS) ×1
CONRAY 43 FOR UROLOGY 50M (MISCELLANEOUS) ×3 IMPLANT
CONT SPEC 4OZ STER OR WHT (MISCELLANEOUS) ×2
CONTAINER SPEC 2.5X3XGRAD LEK (MISCELLANEOUS) ×1 IMPLANT
DRAPE UTILITY 15X26 TOWEL STRL (DRAPES) ×3 IMPLANT
FIBER LASER LITHO 273 (Laser) ×3 IMPLANT
GLOVE BIO SURGEON STRL SZ 6.5 (GLOVE) ×2 IMPLANT
GLOVE BIO SURGEONS STRL SZ 6.5 (GLOVE) ×1
GOWN STRL REUS W/ TWL LRG LVL3 (GOWN DISPOSABLE) ×2 IMPLANT
GOWN STRL REUS W/TWL LRG LVL3 (GOWN DISPOSABLE) ×4
GUIDEWIRE GREEN .038 145CM (MISCELLANEOUS) ×3 IMPLANT
INTRODUCER DILATOR DOUBLE (INTRODUCER) ×3 IMPLANT
KIT RM TURNOVER CYSTO AR (KITS) ×3 IMPLANT
PACK CYSTO AR (MISCELLANEOUS) ×3 IMPLANT
PREP PVP WINGED SPONGE (MISCELLANEOUS) ×3 IMPLANT
PUMP SINGLE ACTION SAP (PUMP) IMPLANT
SENSORWIRE 0.038 NOT ANGLED (WIRE) ×6
SET CYSTO W/LG BORE CLAMP LF (SET/KITS/TRAYS/PACK) ×3 IMPLANT
SHEATH URETERAL 12FRX35CM (MISCELLANEOUS) IMPLANT
SOL .9 NS 3000ML IRR  AL (IV SOLUTION) ×2
SOL .9 NS 3000ML IRR UROMATIC (IV SOLUTION) ×1 IMPLANT
STENT URET 6FRX24 CONTOUR (STENTS) ×3 IMPLANT
STENT URET 6FRX26 CONTOUR (STENTS) IMPLANT
SURGILUBE 2OZ TUBE FLIPTOP (MISCELLANEOUS) ×3 IMPLANT
WATER STERILE IRR 1000ML POUR (IV SOLUTION) ×3 IMPLANT
WIRE SENSOR 0.038 NOT ANGLED (WIRE) ×2 IMPLANT

## 2015-11-22 NOTE — Anesthesia Postprocedure Evaluation (Signed)
Anesthesia Post Note  Patient: Jordan SailorsChristopher R Kriz  Procedure(s) Performed: Procedure(s) (LRB): CYSTOSCOPY/URETEROSCOPY/HOLMIUM LASER/STENT EXCHANGE (Right)  Patient location during evaluation: PACU Anesthesia Type: General Level of consciousness: awake and alert Pain management: pain level controlled Vital Signs Assessment: post-procedure vital signs reviewed and stable Respiratory status: spontaneous breathing, nonlabored ventilation, respiratory function stable and patient connected to nasal cannula oxygen Cardiovascular status: blood pressure returned to baseline and stable Postop Assessment: no signs of nausea or vomiting Anesthetic complications: no    Last Vitals:  Filed Vitals:   11/22/15 1210 11/22/15 1255  BP: 124/80 130/80  Pulse: 61 71  Temp: 36.3 C   Resp: 14 14    Last Pain:  Filed Vitals:   11/22/15 1316  PainSc: 4                  [name]

## 2015-11-22 NOTE — H&P (View-Only) (Signed)
11/03/2015 1:15 PM   Jordan Mcpherson [birth year] [id number]  Referring provider: No referring provider defined for this encounter.  Chief Complaint  Patient presents with  . Nephrolithiasis    referred by ER    HPI: Patient is a 25 year old Caucasian male who is referred to [place] from [facility] for a right ureteral stone causing obstruction and pain.   Patient states that 1 week ago he had the sudden onset of right-sided flank pain that radiated to the right waist. The pain was so intense he started to vomit. He went to the emergency room for further evaluation and management.  In the emergency room,  he received IV analgesics and IV antiemetics.     CT renal stone study performed on 10/28/2015 noted there is mild right hydronephrosis and proximal right hydroureter. Mild stranding of surrounding fat proximal right ureter.  There is 4 mm calcified obstructive calculus in proximal right ureter at the level of L2-L3 disc space.  Bilateral renal nonobstructive nephrolithiasis.  No small bowel obstruction.  No pericecal inflammation. Normal appendix. No calcified calculi are noted within under distended urinary bladder.  I personally reviewed the films with the patient.  He states the pain was manageable until 4 days later where he return to the emergency room for further pain management.  KUB taken at that time noted the right ureteral stone.  Patient is currently taking tamsulosin 0.4 mg daily, naproxen 500 mg when necessary, Dilaudid 2 mg every 12 hours, Zofran ODT T4 milligram and Cipro 500 mg twice daily.  He still has Percocet from the previous ED visit.    Patient has been taking naproxen during the day as he is still trying to continue to work. The last time he took naproxen was this morning.  He is taking narcotic pain medicine sparingly as it causes him drowsiness.  Today, he is experiencing urinary frequency, nocturia, intermittency, hesitancy,  straining to urinate, gross hematuria and a weak urinary stream.  The pain is located in the right flank radiating to the right waist. He states the pain is 10/10.  He has not had any further nausea or vomiting. He denies any fevers and chills.  He does have a prior history of nephrolithiasis, passing 2 stones spontaneously in the past. He has not seen a urologist. His stone composition is unknown.  His creatinine is 1.47 up from 1.17 6 days ago.  His UA today is positive for greater than 30 RBC's per high-power field and 11-30 WBC's per high-power field.   PMH: Past Medical History  Diagnosis Date  . Renal disorder   . Kidney stones   . Anxiety   . Depression     Surgical History: Past Surgical History  Procedure Laterality Date  . None      Home Medications:    Medication List       This list is accurate as of: 11/03/15  1:15 PM.  Always use your most recent med list.               bismuth subsalicylate 132 MG chewable tablet  Commonly known as:  PEPTO BISMOL  Chew 524 mg by mouth as needed for indigestion or diarrhea or loose stools. Reported on 11/03/2015     ciprofloxacin 500 MG tablet  Commonly known as:  CIPRO  Take 1 tablet (500 mg total) by mouth 2 (two) times daily.     HYDROmorphone 2 MG tablet  Commonly known as:  DILAUDID  Take 1 tablet (2 mg total) by mouth every 6 (six) hours as needed for severe pain.     ibuprofen 200 MG tablet  Commonly known as:  ADVIL,MOTRIN  Take 400 mg by mouth every 6 (six) hours as needed for moderate pain.     naproxen 500 MG tablet  Commonly known as:  NAPROSYN  Take 1 tablet (500 mg total) by mouth 2 (two) times daily with a meal.     ondansetron 4 MG tablet  Commonly known as:  ZOFRAN  Take 1 tablet (4 mg total) by mouth daily as needed for nausea or vomiting.     oxyCODONE-acetaminophen 5-325 MG tablet  Commonly known as:  ROXICET  Take 1 tablet by mouth every 6 (six) hours as needed.     tamsulosin 0.4 MG Caps  capsule  Commonly known as:  FLOMAX  Take 1 capsule (0.4 mg total) by mouth daily.        Allergies: No Known Allergies  Family History: Family History  Problem Relation Age of Onset  . Kidney disease Neg Hx   . Kidney Stones Father   . Prostate cancer Maternal Uncle     Social History:  reports that he has been smoking.  He has never used smokeless tobacco. He reports that he does not drink alcohol or use illicit drugs.  ROS: UROLOGY Frequent Urination?: Yes Hard to postpone urination?: No Burning/pain with urination?: No Get up at night to urinate?: Yes Leakage of urine?: No Urine stream starts and stops?: Yes Trouble starting stream?: Yes Do you have to strain to urinate?: Yes Blood in urine?: Yes Urinary tract infection?: No Sexually transmitted disease?: No Injury to kidneys or bladder?: No Painful intercourse?: No Weak stream?: Yes Erection problems?: No Penile pain?: No  Gastrointestinal Nausea?: Yes Vomiting?: Yes Indigestion/heartburn?: No Diarrhea?: No Constipation?: Yes  Constitutional Fever: Yes Night sweats?: Yes Weight loss?: No Fatigue?: No  Skin Skin rash/lesions?: No Itching?: No  Eyes Blurred vision?: No Double vision?: No  Ears/Nose/Throat Sore throat?: No Sinus problems?: No  Hematologic/Lymphatic Swollen glands?: No Easy bruising?: No  Cardiovascular Leg swelling?: No Chest pain?: No  Respiratory Cough?: No Shortness of breath?: No  Endocrine Excessive thirst?: Yes  Musculoskeletal Back pain?: Yes Joint pain?: No  Neurological Headaches?: No Dizziness?: Yes  Psychologic Depression?: No Anxiety?: No  Physical Exam: BP 127/82 mmHg  Pulse 73  Ht _0  (1.803 m)  Wt 219 lb 1.6 oz (99.383 kg)  BMI 30.57 kg/m2  Constitutional: Well nourished. Alert and oriented, No acute distress. HEENT: [place] AT, moist mucus membranes. Trachea midline, no masses. Cardiovascular: No clubbing, cyanosis, or  edema. Respiratory: Normal respiratory effort, no increased work of breathing. GI: Abdomen is soft, non tender, non distended, no abdominal masses. Liver and spleen not palpable.  No hernias appreciated.  Stool sample for occult testing is not indicated.   GU: Right CVA tenderness.  No bladder fullness or masses.   Skin: No rashes, bruises or suspicious lesions. Lymph: No cervical or inguinal adenopathy. Neurologic: Grossly intact, no focal deficits, moving all 4 extremities. Psychiatric: Normal mood and affect.  Laboratory Data: Lab Results  Component Value Date   WBC 11.8* 10/31/2015   HGB 13.6 10/31/2015   HCT 40.0 10/31/2015   MCV 85.1 10/31/2015   PLT 200 10/31/2015    Lab Results  Component Value Date   CREATININE 1.47* 10/31/2015     Lab Results  Component Value Date   AST 18 10/28/2015  Lab Results  Component Value Date   ALT 17 10/28/2015     Urinalysis Results for orders placed or performed in visit on 11/03/15  Microscopic Examination  Result Value Ref Range   WBC, UA 11-30 (A) 0 -  5 /hpf   RBC, UA >30 (H) 0 -  2 /hpf   Epithelial Cells (non renal) 0-10 0 - 10 /hpf   Mucus, UA Present (A) Not Estab.   Bacteria, UA None seen None seen/Few  Urinalysis, Complete  Result Value Ref Range   Specific Gravity, UA >1.030 (H) 1.005 - 1.030   pH, UA 6.0 5.0 - 7.5   Color, UA Yellow Yellow   Appearance Ur Clear Clear   Leukocytes, UA Trace (A) Negative   Protein, UA Negative Negative/Trace   Glucose, UA Negative Negative   Ketones, UA Negative Negative   RBC, UA 3+ (A) Negative   Bilirubin, UA Negative Negative   Urobilinogen, Ur 1.0 0.2 - 1.0 mg/dL   Nitrite, UA Negative Negative   Microscopic Examination See below:     Pertinent Imaging: CLINICAL DATA: Right flank pain starting this morning, vomited twice, history of kidney stones  EXAM: CT ABDOMEN AND PELVIS WITHOUT CONTRAST  TECHNIQUE: Multidetector CT imaging of the abdomen and pelvis was  performed following the standard protocol without IV contrast.  COMPARISON: 05/26/2011  FINDINGS: Lower chest: Lung bases are unremarkable.  Hepatobiliary: Unenhanced liver shows no biliary ductal dilatation.  Pancreas: Unenhanced pancreas is unremarkable.  Spleen: Unenhanced spleen is unremarkable.  Adrenals/Urinary Tract: No adrenal gland mass is noted. There is mild right hydronephrosis and mild proximal right hydroureter. Punctate nonobstructive calcified calculus in upper pole of the right kidney measures 2 mm. Nonobstructive calcified calculus in midpole of the left kidney measures 8 mm.  Axial image 42 there is 4 mm calcified calculus in proximal right ureter at the level of L2-L3 disc space. Mild proximal right periureteral stranding. Bilateral distal ureter is unremarkable. No calcified calculi are noted within under distended urinary bladder.  Stomach/Bowel: No small bowel obstruction. No thickened or dilated small bowel loops. No pericecal inflammation. Normal appendix is noted in axial image 55. Moderate stool noted in proximal sigmoid colon. No distal colitis or diverticulitis. No distal colonic obstruction.  Vascular/Lymphatic: No aortic aneurysm. No retroperitoneal or mesenteric adenopathy.  Reproductive: No pelvic mass. Prostate gland and seminal vesicles are unremarkable.  Other: No ascites or free abdominal air. No inguinal adenopathy.  Musculoskeletal: No destructive bony lesions are noted. Alignment, disc spaces and vertebral body heights are preserved.  IMPRESSION: 1. There is mild right hydronephrosis and proximal right hydroureter. Mild stranding of surrounding fat proximal right ureter. 2. There is 4 mm calcified obstructive calculus in proximal right ureter at the level of L2-L3 disc space. 3. Bilateral renal nonobstructive nephrolithiasis. 4. No small bowel obstruction. 5. No pericecal inflammation. Normal appendix. 6. No  calcified calculi are noted within under distended urinary bladder.   Electronically Signed  By: Lahoma Crocker M.D.  On: 10/28/2015 10:35  CLINICAL DATA: 25 year old male with flank pain.  EXAM: ABDOMEN - 1 VIEW  COMPARISON: CT dated 10/28/2015  FINDINGS: A 6 mm radiopaque focus along the right lateral aspect of the L4 vertebrae may correspond to the right UPJ calculus seen on the prior CT. A 7 mm radiopaque density over the left renal silhouette likely corresponds to the stone seen on the prior CT. There is no bowel obstruction. No free air. The osseous structures and soft tissues appear unremarkable.  IMPRESSION: A 6 mm radiopaque focus to the right of the L4 vertebrae may correspond to the previously seen right UPJ calculus. -   Electronically Signed  By: Anner Crete M.D.  On: 11/01/2015 00:16   Assessment & Plan:    Patient will undergo a right ureteroscopy with right laser lithotripsy with right ureteral stent placement for definitive treatment of his right ureteral stone.    1. Right ureteral stone:   We discussed definitive management for his right ureteral stone, such as an MET, ESWL and URS/LL/ureteral stent placement.  Patient stated that he could not go on with the pain as it is and he would like to pursue a surgical procedure.  I discussed the risks and success rate of both the ESWL and URS//ureteral stent placement. Patient will like to proceed with URS/LL/ureteral stent placement.   I explained to the patient how the procedure is performed and the risks involved.  I informed patient that he will have a stent placed during the procedure and will remain in place after the procedure for a short time.  It will be removed in the office with a cystoscope, unless a string in left in place.  I informed that patient that about 50% of patients who undergo ureteroscopy will have "stent pain," and this is by far the most common risk/complaint following  ureteroscopy.     They may be residual stones within the kidney or ureter may be present up to 40% of the time following ureteroscopy, depending on the original stone size and location. These stone fragments will be seen and addressed on follow-up imaging.  Injury to the ureter is the most common intra-operative complication during ureteroscopy. The reported risk of perforation ranges greatly, depending on whether it is defined as a complete perforation (0.1-0.7% - think of this as a hole through the entire ureter), a partial perforation (1.6% - a hole nearly through the entire ureter), or mucosal tear/scrape (5% - these are similar to a sore on the inside of the mouth). Almost 100% of these will heal with prolonged stenting (anywhere between 2 - 4 weeks). Should a large perforation occur, your urologist may chose to stop the procedure and return on another day when the ureter has had time to heal.  I also explained the risks of general anesthesia, such as: MI, CVA, paralysis, coma and/or death.  He should contact our office or seek treatment in the ED if he becomes febrile, pain is difficult to control or in the left flank in order to arrange for emergent/urgent intervention.  While he is waiting for surgery,  I have encouraged him to increase his fluid intake to 2.5 L daily, take the tamsulosin, not to wait until the pain becomes severe to take the pain medication and strain his urine in an effort to facilitate MET.     - Urinalysis, Complete - CULTURE, URINE COMPREHENSIVE  2. Renal stones:   Patient with a 8 mm non obstructing left renal stone and a small punctate stone in each kidney.  I stated that this stone will need to be addressed at a later date, unless he starts developing left flank pain.  At that time, he should contact our office or seek evaluation in the ED to see if that stone has migrated and causing obstruction.    3. Right hydronephrosis:   Patient was found to have right  hydronephrosis due to an ureteral.  A RUS will be obtained one month after he has completed definitive  treatment for his stone.      4. Gross hematuria:   We will continue to monitor the patient's UA after the treatment/passage of the stone to ensure the hematuria has resolved.  If hematuria persists, we will pursue a hematuria workup with CT Urogram and cystoscopy if appropriate.  Return for right URS/LL/right ureteral stent placement.  These notes generated with voice recognition software. I apologize for typographical errors.  Zara Council, [facility] [address] [place], [place] [address] [phone]

## 2015-11-22 NOTE — Discharge Instructions (Signed)
You have a ureteral stent in place.  This is a tube that extends from your kidney to your bladder.  This may cause urinary bleeding, burning with urination, and urinary frequency.  Please call our office or present to the ED if you develop fevers >101 or pain which is not able to be controlled with oral pain medications.  You may be given either Flomax and/ or ditropan to help with bladder spasms and stent pain in addition to pain medications.    Your stent is attached to a string taped to the head of your penis.  You may untape this on Friday and pull gently until completely removed.  If you have trouble, please call.    [facility] [address], [address] [place], [place] [address] [phone]   AMBULATORY SURGERY  DISCHARGE INSTRUCTIONS   1) The drugs that you were given will stay in your system until tomorrow so for the next 24 hours you should not:  A) Drive an automobile B) Make any legal decisions C) Drink any alcoholic beverage   2) You may resume regular meals tomorrow.  Today it is better to start with liquids and gradually work up to solid foods.  You may eat anything you prefer, but it is better to start with liquids, then soup and crackers, and gradually work up to solid foods.   3) Please notify your doctor immediately if you have any unusual bleeding, trouble breathing, redness and pain at the surgery site, drainage, fever, or pain not relieved by medication.    4) Additional Instructions:        Please contact your physician with any problems or Same Day Surgery at [phone], Monday through Friday 6 am to 4 pm, or [place] [facility] Main number at [phone].

## 2015-11-22 NOTE — Anesthesia Preprocedure Evaluation (Addendum)
Anesthesia Evaluation  Patient identified by MRN, date of birth, ID band Patient awake    Reviewed: Allergy & Precautions, NPO status , Patient's Chart, lab work & pertinent test results, reviewed documented beta blocker date and time   Airway Mallampati: III  TM Distance: >3 FB     Dental  (+) Chipped   Pulmonary Current Smoker,           Cardiovascular      Neuro/Psych PSYCHIATRIC DISORDERS Anxiety Depression    GI/Hepatic GERD  Controlled,  Endo/Other    Renal/GU Renal InsufficiencyRenal disease     Musculoskeletal   Abdominal   Peds  Hematology   Anesthesia Other Findings   Reproductive/Obstetrics                            Anesthesia Physical Anesthesia Plan  ASA: II  Anesthesia Plan: General   Post-op Pain Management:    Induction: Intravenous  Airway Management Planned: Oral ETT  Additional Equipment:   Intra-op Plan:   Post-operative Plan:   Informed Consent: I have reviewed the patients History and Physical, chart, labs and discussed the procedure including the risks, benefits and alternatives for the proposed anesthesia with the patient or authorized representative who has indicated his/her understanding and acceptance.     Plan Discussed with: CRNA  Anesthesia Plan Comments:         Anesthesia Quick Evaluation

## 2015-11-22 NOTE — Interval H&P Note (Signed)
History and Physical Interval Note:  11/22/2015 10:03 AM  Jordan Mcpherson  has presented today for surgery, with the diagnosis of right ureteral stone,right renal stone,right hydronephrosis  The various methods of treatment have been discussed with the patient and family. After consideration of risks, benefits and other options for treatment, the patient has consented to  Procedure(s): CYSTOSCOPY/URETEROSCOPY/HOLMIUM LASER/STENT EXCHANGE (Right) as a surgical intervention .  The patient's history has been reviewed, patient examined, no change in status, stable for surgery.  I have reviewed the patient's chart and labs.  Questions were answered to the patient's satisfaction.    RRR CTAB  Return 1 week after ureteral stent placement for staged procedure for treatment of right ureteral stone.    Vanna ScotlandAshley Adhrit Krenz

## 2015-11-22 NOTE — Anesthesia Procedure Notes (Signed)
Procedure Name: Intubation Performed by: Lile Mccurley Pre-anesthesia Checklist: Patient identified, Patient being monitored, Timeout performed, Emergency Drugs available and Suction available Patient Re-evaluated:Patient Re-evaluated prior to inductionOxygen Delivery Method: Circle system utilized Preoxygenation: Pre-oxygenation with 100% oxygen Intubation Type: IV induction Ventilation: Mask ventilation without difficulty Laryngoscope Size: Mac and 3 Grade View: Grade I Tube type: Oral Tube size: 7.5 mm Number of attempts: 1 Airway Equipment and Method: Stylet Placement Confirmation: ETT inserted through vocal cords under direct vision,  positive ETCO2 and breath sounds checked- equal and bilateral Secured at: 22 cm Tube secured with: Tape Dental Injury: Teeth and Oropharynx as per pre-operative assessment        

## 2015-11-22 NOTE — Op Note (Signed)
Date of procedure: 11/22/2015  Preoperative diagnosis:  1. Right kidney stones   Postoperative diagnosis:  1. Same as above   Procedure: 1. Right ureteroscopy 2. Laser lithotripsy 3. Right ureteral stent exchange 4. Right retrograde pyelogram  Surgeon: Vanna Scotland, MD  Anesthesia: General  Complications: None  Intraoperative findings: 5 mm proximal ureteral stone treated  EBL: Minimal  Specimens: None  Drains: 6 x 26 French double-J ureteral stent with string left in place  Indication: Jordan Mcpherson is a 25 y.o. patient with partially 5 mm right proximal ureteral stone. He was taken to the OR last week for attempted ureteroscopy but due to ureteral narrowing, the scope was unable to be advanced.  The ureteral stent was placed and he returns today for a staged procedure. After reviewing the management options for treatment, he elected to proceed with the above surgical procedure(s). We have discussed the potential benefits and risks of the procedure, side effects of the proposed treatment, the likelihood of the patient achieving the goals of the procedure, and any potential problems that might occur during the procedure or recuperation. Informed consent has been obtained.  Description of procedure:  The patient was taken to the operating room and general anesthesia was induced.  The patient was placed in the dorsal lithotomy position, prepped and draped in the usual sterile fashion, and preoperative antibiotics were administered. A preoperative time-out was performed.   A 21 French rigid cystoscope was advanced per urethra into the bladder. Attention was turned to the right ureteral orifice from which a ureteral stent was seen emanating. The distal coil was grasped and brought out to the level of the urethral meatus. The stent was then cannulated using a sensor wire up to level of the kidney alongside of the stone which could be seen within the proximal ureter. A dual  lumen sheath was used just within the distal ureter and a second Super Stiff wire was introduced up to level of the kidney without difficulty. A dual lumen flexible ureteroscope was then passed easily this time along the Super Stiff wire up to level of the renal pelvis. Upon passing the scope at the level of the stone, the stone toppled into the renal pelvis which could be easily seen on fluoroscopic guidance. The stent was then pushed into a midpole calyx and a 275  laser fiber was used using the settings of 0.2 J and 40 Hz to completely dust the stone. Oral pyeloscopy was then performed visualizing each and every calyx until no residual stone was noted. The scope was then backed to level of the UPJ and retrograde pyelogram was performed. This revealed no filling defects and a mildly dilated collecting system. This created a roadmap to ensure the each and every calyx with been identified and there was no residual stone burden. The scope was then backed down the length of the ureter inspecting along the way. There was some edema within the proximal ureter with a stented previously been lodged but no ureteral stone fragments or injuries noted.  The safety wire was then backloaded over a rigid cystoscope and a 6 x 26 French double-J ureteral stent was replaced under fluoroscopic guidance. The wire was partially withdrawn until full coil was noted within the renal pelvis. The wire was then fully withdrawn and a full coil was noted within the bladder. The string was left on the stent. The bladder was drained and the scope was carefully removed. The stent string was secured to the patient's glans  using Maxon Tegaderm. He was then cleaned and dried, repositioned the supine position, reversed anesthesia, taken to the PACU in sterile condition.  Plan: Patient will follow-up in 4 weeks with a renal ultrasound prior. He will remove his own stent on Friday. We will address how to proceed with his fairly large left-sided  nonobstructing stone at that time.  Vanna ScotlandAshley Latrell Mcpherson, M.D.

## 2015-11-22 NOTE — Transfer of Care (Signed)
Immediate Anesthesia Transfer of Care Note  Patient: Jordan Mcpherson  Procedure(s) Performed: Procedure(s): CYSTOSCOPY/URETEROSCOPY/HOLMIUM LASER/STENT EXCHANGE (Right)  Patient Location: PACU  Anesthesia Type:General  Level of Consciousness: sedated  Airway & Oxygen Therapy: Patient Spontanous Breathing and Patient connected to face mask oxygen  Post-op Assessment: Report given to RN and Post -op Vital signs reviewed and stable  Post vital signs: Reviewed and stable  Last Vitals:  Filed Vitals:   11/22/15 0951 11/22/15 1115  BP: 118/76 110/68  Pulse: 71 84  Temp: 36.9 C 36.9 C  Resp: 16 18    Complications: No apparent anesthesia complications

## 2015-12-21 ENCOUNTER — Ambulatory Visit: Payer: 59

## 2015-12-22 ENCOUNTER — Ambulatory Visit (INDEPENDENT_AMBULATORY_CARE_PROVIDER_SITE_OTHER): Payer: 59 | Admitting: Urology

## 2015-12-22 ENCOUNTER — Encounter: Payer: Self-pay | Admitting: Urology

## 2015-12-22 ENCOUNTER — Ambulatory Visit
Admission: RE | Admit: 2015-12-22 | Discharge: 2015-12-22 | Disposition: A | Discharge status: Home / Self Care | Payer: Self-pay | Source: Ambulatory Visit | Attending: Urology | Admitting: Urology

## 2015-12-22 VITALS — BP 118/77 | HR 84 | Ht 71.0 in | Wt 218.6 lb

## 2015-12-22 DIAGNOSIS — N200 Calculus of kidney: Secondary | ICD-10-CM | POA: Diagnosis not present

## 2015-12-22 DIAGNOSIS — N179 Acute kidney failure, unspecified: Secondary | ICD-10-CM

## 2015-12-25 NOTE — Progress Notes (Signed)
12/22/2015 3:02 PM   Jordan Mcpherson 03-13-1991 [id number]  Referring provider: No referring provider defined for this encounter.  Chief Complaint  Patient presents with  . Follow-up    RUS Results    HPI: 25 year old male with bilateral nephrolithiasis status post right ureteroscopy/laser lithotripsy  for a 5 mm right proximal ureteral stone on 11/22/2015.  This is performed as a staged procedure due to ureteral narrowing requiring ureteral stent placement for passive dilation.  Follow-up renal ultrasound today shows no residual stones or hydronephrosis on this side.  He does have an 8 mm left mid pole calculus (measured 12 mm on RUS) which remains untreated.  Today, he has no complaints. He is able to remove the stent without difficulty. He is anxious to have this left-sided stone treated due to the size and concern for impending obstruction. He has a new job and is very hesitant to schedule a procedure as he is worried about leaving or missing work. He states that he would prefer to intervene on this stone some time in October once he's been at his job for 3 months.  He does have a personal history of nephrolithiasis. He passed 2 stones spontaneously in the past. Stone composition is unknown.  He does try to drink plenty of water and his cut back on sodas.  PMH: Past Medical History:  Diagnosis Date  . Anxiety   . Depression   . GERD (gastroesophageal reflux disease)    very rare-no meds  . Hx MRSA infection   . Kidney stones   . Renal disorder     Surgical History: Past Surgical History:  Procedure Laterality Date  . CYSTOSCOPY/URETEROSCOPY/HOLMIUM LASER/STENT PLACEMENT Right 11/15/2015   Procedure: CYSTOSCOPY/URETEROSCOPY/STENT PLACEMENT;  Surgeon: Vanna Scotland, MD;  Location: [facility] ORS;  Service: Urology;  Laterality: Right;  . CYSTOSCOPY/URETEROSCOPY/HOLMIUM LASER/STENT PLACEMENT Right 11/22/2015   Procedure: CYSTOSCOPY/URETEROSCOPY/HOLMIUM LASER/STENT  EXCHANGE;  Surgeon: Vanna Scotland, MD;  Location: [facility] ORS;  Service: Urology;  Laterality: Right;  . NO PAST SURGERIES    . none    . PILONIDAL CYST DRAINAGE      Home Medications:    Medication List       Accurate as of 12/22/15 11:59 PM. Always use your most recent med list.          HYDROmorphone 2 MG tablet Commonly known as:  DILAUDID Take 1 tablet (2 mg total) by mouth every 6 (six) hours as needed for severe pain.   oxybutynin 5 MG tablet Commonly known as:  DITROPAN Take 1 tablet (5 mg total) by mouth every 8 (eight) hours as needed for bladder spasms.   oxyCODONE-acetaminophen 5-325 MG tablet Commonly known as:  ROXICET Take 1 tablet by mouth every 6 (six) hours as needed.   tamsulosin 0.4 MG Caps capsule Commonly known as:  FLOMAX Take 1 capsule (0.4 mg total) by mouth daily.       Allergies: No Known Allergies  Family History: Family History  Problem Relation Age of Onset  . Kidney disease Neg Hx   . Kidney Stones Father   . Prostate cancer Maternal Uncle     Social History:  reports that he has been smoking Cigarettes.  He has a 3.50 pack-year smoking history. He has never used smokeless tobacco. He reports that he does not drink alcohol or use drugs.  ROS: UROLOGY Frequent Urination?: No Hard to postpone urination?: No Burning/pain with urination?: No Get up at night to urinate?: No Leakage of urine?: No Urine  stream starts and stops?: No Trouble starting stream?: No Do you have to strain to urinate?: No Blood in urine?: No Urinary tract infection?: No Sexually transmitted disease?: No Injury to kidneys or bladder?: No Painful intercourse?: No Weak stream?: No Erection problems?: No Penile pain?: No  Gastrointestinal Nausea?: No Vomiting?: No Indigestion/heartburn?: No Diarrhea?: No Constipation?: No  Constitutional Fever: No Night sweats?: No Weight loss?: No Fatigue?: No  Skin Skin rash/lesions?: No Itching?:  No  Eyes Blurred vision?: No Double vision?: No  Ears/Nose/Throat Sore throat?: No Sinus problems?: No  Hematologic/Lymphatic Swollen glands?: No Easy bruising?: No  Cardiovascular Leg swelling?: No Chest pain?: No  Respiratory Cough?: No Shortness of breath?: No  Endocrine Excessive thirst?: No  Musculoskeletal Back pain?: No Joint pain?: No  Neurological Headaches?: No Dizziness?: No  Psychologic Depression?: No Anxiety?: No  Physical Exam: BP 118/77 (BP Location: Left Arm, Patient Position: Sitting, Cuff Size: Large)   Pulse 84   Ht  (1.803 m)   Wt 218 lb 9.6 oz (99.2 kg)   BMI 30.49 kg/m   Constitutional:  Alert and oriented, No acute distress.  Accompanied by ? Wife/ girlfriend today. HEENT: [place] AT, moist mucus membranes.  Trachea midline, no masses. Cardiovascular: No clubbing, cyanosis, or edema. Respiratory: Normal respiratory effort, no increased work of breathing. Skin: No rashes, bruises or suspicious lesions.. Neurologic: Grossly intact, no focal deficits, moving all 4 extremities. Psychiatric: Normal mood and affect.  Laboratory Data: Lab Results  Component Value Date   WBC 11.8 (H) 10/31/2015   HGB 13.6 10/31/2015   HCT 40.0 10/31/2015   MCV 85.1 10/31/2015   PLT 200 10/31/2015    Lab Results  Component Value Date   CREATININE 1.47 (H) 10/31/2015    Pertinent Imaging: Study Result   CLINICAL DATA:  History of kidney stones.  Flank pain.  EXAM: RENAL / URINARY TRACT ULTRASOUND COMPLETE  COMPARISON:  11/09/2015.  FINDINGS: Right Kidney:  Length: 11.6 cm. Echogenicity within normal limits. No mass or hydronephrosis visualized.  Left Kidney:  Length: 11.7 cm. Echogenicity within normal limits. No mass or hydronephrosis visualized. 1.2 cm nonobstructing left renal stone.  Bladder:  Appears normal for degree of bladder distention.  IMPRESSION: 1.  1.2 cm nonobstructing left renal stone.  2. Exam  otherwise unremarkable.   Electronically Signed   By: Maisie Fus  Register   On: 12/22/2015 13:43   RUS personally reviewed today and with the patient.  In addition, his previous CT scan was also reviewed.   Assessment & Plan:   1. Left nephrolithiasis Discussed plan moving forward to treat left nonobstructing stone.  We discussed various treatment options including ESWL vs. ureteroscopy, laser lithotripsy, and stent. We discussed the risks and benefits of both including bleeding, infection, damage to surrounding structures, efficacy with need for possible further intervention, and need for temporary ureteral stent.  He is very concerned about developing steinstrasse obstruction especially given that his right ureter was very narrow. He is anxious about missing work.  He is most interested shockwave lithotripsy but would prefer to have a ureteral stent placed on the morning of the procedure in order to avoid any stone impaction within the ureter and help facilitate stone passage. This seems very reasonable.  We will arrange for the stent to be removed one to 2 weeks following the procedure in the office as long as there is no large stone fragments and follow-up KUB.  [place] Lions questions were answered today. He would like to schedule this  for early October.  2. Recurrent kidney stones We discussed with the patient will need a 24-hour urine metabolic workup on 2 stone free.  3. Acute kidney injury (HCC) Acute kidney injury noted at the time of urinary obstruction (Cr 1.47 from baseline 1.1). Suspect this is resolved now that he obstruction is relieved. We'll recheck at a later date.   Schedule Left ureteral stent placement, left   Vanna ScotlandAshley Oliveah Zwack, MD  [facility] [address], [address] [place], [place] [address] [phone]

## 2016-01-24 ENCOUNTER — Telehealth: Payer: Self-pay | Admitting: Radiology

## 2016-01-24 NOTE — Telephone Encounter (Signed)
[facility] Need to discuss upcoming surgery with Dr Apolinar JunesBrandon & ESWL.

## 2016-01-28 NOTE — Telephone Encounter (Signed)
Notified pt of stent placement & ESWL scheduled with Dr Apolinar JunesBrandon on 02/17/16 & pre-admit testing appt on 02/03/16 @8 :15 & to RTC following pre-admit appt to fill out ESWL paperwork. Pt voices understanding.

## 2016-02-03 ENCOUNTER — Other Ambulatory Visit: Payer: 59

## 2016-02-03 NOTE — Telephone Encounter (Signed)
Notified pt of pre-op appt r/s to 02/08/16 @11 :00. Advised pt to RTC at 10:00 prior to that appt to fill out ESWL paperwork. Pt voices understanding.

## 2016-02-03 NOTE — Telephone Encounter (Signed)
Pt [facility]t. [facility].

## 2016-02-08 ENCOUNTER — Encounter
Admission: RE | Admit: 2016-02-08 | Discharge: 2016-02-08 | Disposition: A | Discharge status: Home / Self Care | Payer: 59 | Source: Ambulatory Visit | Attending: Urology | Admitting: Urology

## 2016-02-08 ENCOUNTER — Other Ambulatory Visit: Payer: 59

## 2016-02-08 ENCOUNTER — Encounter: Payer: Self-pay | Admitting: Radiology

## 2016-02-08 ENCOUNTER — Other Ambulatory Visit: Payer: Self-pay | Admitting: Radiology

## 2016-02-08 DIAGNOSIS — N200 Calculus of kidney: Secondary | ICD-10-CM | POA: Diagnosis not present

## 2016-02-08 DIAGNOSIS — Z8614 Personal history of Methicillin resistant Staphylococcus aureus infection: Secondary | ICD-10-CM | POA: Insufficient documentation

## 2016-02-08 DIAGNOSIS — Z01812 Encounter for preprocedural laboratory examination: Secondary | ICD-10-CM | POA: Insufficient documentation

## 2016-02-08 LAB — SURGICAL PCR SCREEN
MRSA, PCR: NEGATIVE
STAPHYLOCOCCUS AUREUS: NEGATIVE

## 2016-02-08 NOTE — Patient Instructions (Signed)
  Your procedure is scheduled on: Thurs. 02/17/16 Report to Day Surgery. To find out your arrival time please call [phone] between 1PM - 3PM on Wed. 02/16/16.  Remember: Instructions that are not followed completely may result in serious medical risk, up to and including death, or upon the discretion of your surgeon and anesthesiologist your surgery may need to be rescheduled.    __x__ 1. Do not eat food or drink liquids after midnight. No gum chewing or hard candies.     __x__ 2. No Alcohol for 24 hours before or after surgery.   __x__ 3. Do Not Smoke For 24 Hours Prior to Your Surgery.   ____ 4. Bring all medications with you on the day of surgery if instructed.    __x__ 5. Notify your doctor if there is any change in your medical condition     (cold, fever, infections).       Do not wear jewelry, make-up, hairpins, clips or nail polish.  Do not wear lotions, powders, or perfumes. You may wear deodorant.  Do not shave 48 hours prior to surgery. Men may shave face and neck.  Do not bring valuables to the hospital.    [facility] for any belongings or valuables.               Contacts, dentures or bridgework may not be worn into surgery.  Leave your suitcase in the car. After surgery it may be brought to your room.  For patients admitted to the hospital, discharge time is determined by your                treatment team.   Patients discharged the day of surgery will not be allowed to drive home.   Please read over the following fact sheets that you were given:   MRSA Information   ____ Take these medicines the morning of surgery with A SIP OF WATER:    1.   2.   3.   4.  5.  6.  ____ Fleet Enema (as directed)   ____ Use CHG Soap as directed  ____ Use inhalers on the day of surgery  ____ Stop metformin 2 days prior to surgery    ____ Take 1/2 of usual insulin dose the night before surgery and none on the morning of surgery.   ____ Stop  Coumadin/Plavix/aspirin on   __x__ Stop Anti-inflammatories on tylenol only until after prodecure   ____ Stop supplements until after surgery.    ____ Bring C-Pap to the hospital.

## 2016-02-09 LAB — URINALYSIS, COMPLETE
BILIRUBIN UA: NEGATIVE
Glucose, UA: NEGATIVE
KETONES UA: NEGATIVE
NITRITE UA: NEGATIVE
SPEC GRAV UA: 1.025 (ref 1.005–1.030)
Urobilinogen, Ur: 1 mg/dL (ref 0.2–1.0)
pH, UA: 6.5 (ref 5.0–7.5)

## 2016-02-09 LAB — MICROSCOPIC EXAMINATION

## 2016-02-11 LAB — CULTURE, URINE COMPREHENSIVE

## 2016-02-17 ENCOUNTER — Ambulatory Visit: Payer: 59 | Admitting: Anesthesiology

## 2016-02-17 ENCOUNTER — Encounter: Payer: Self-pay | Admitting: *Deleted

## 2016-02-17 ENCOUNTER — Ambulatory Visit: Payer: 59

## 2016-02-17 ENCOUNTER — Encounter
Admission: RE | Disposition: A | Discharge status: Home / Self Care | Payer: Self-pay | Source: Ambulatory Visit | Attending: Urology

## 2016-02-17 ENCOUNTER — Ambulatory Visit
Admission: RE | Admit: 2016-02-17 | Discharge: 2016-02-17 | Disposition: A | Discharge status: Home / Self Care | Payer: 59 | Source: Ambulatory Visit | Attending: Urology | Admitting: Urology

## 2016-02-17 DIAGNOSIS — Z79899 Other long term (current) drug therapy: Secondary | ICD-10-CM | POA: Insufficient documentation

## 2016-02-17 DIAGNOSIS — Z8042 Family history of malignant neoplasm of prostate: Secondary | ICD-10-CM | POA: Insufficient documentation

## 2016-02-17 DIAGNOSIS — Z8614 Personal history of Methicillin resistant Staphylococcus aureus infection: Secondary | ICD-10-CM | POA: Diagnosis not present

## 2016-02-17 DIAGNOSIS — F17210 Nicotine dependence, cigarettes, uncomplicated: Secondary | ICD-10-CM | POA: Diagnosis not present

## 2016-02-17 DIAGNOSIS — Z87442 Personal history of urinary calculi: Secondary | ICD-10-CM | POA: Diagnosis not present

## 2016-02-17 DIAGNOSIS — Z841 Family history of disorders of kidney and ureter: Secondary | ICD-10-CM | POA: Insufficient documentation

## 2016-02-17 DIAGNOSIS — F419 Anxiety disorder, unspecified: Secondary | ICD-10-CM | POA: Insufficient documentation

## 2016-02-17 DIAGNOSIS — K219 Gastro-esophageal reflux disease without esophagitis: Secondary | ICD-10-CM | POA: Diagnosis not present

## 2016-02-17 DIAGNOSIS — N200 Calculus of kidney: Secondary | ICD-10-CM

## 2016-02-17 DIAGNOSIS — F329 Major depressive disorder, single episode, unspecified: Secondary | ICD-10-CM | POA: Diagnosis not present

## 2016-02-17 HISTORY — PX: EXTRACORPOREAL SHOCK WAVE LITHOTRIPSY: SHX1557

## 2016-02-17 HISTORY — PX: CYSTOSCOPY WITH STENT PLACEMENT: SHX5790

## 2016-02-17 SURGERY — LITHOTRIPSY, ESWL
Anesthesia: Moderate Sedation | Laterality: Left

## 2016-02-17 SURGERY — CYSTOSCOPY, WITH STENT INSERTION
Anesthesia: General | Laterality: Left | Wound class: Clean Contaminated

## 2016-02-17 MED ORDER — CEFAZOLIN SODIUM-DEXTROSE 2-3 GM-% IV SOLR
INTRAVENOUS | Status: DC | PRN
  Administered 2016-02-17: 2 g via INTRAVENOUS

## 2016-02-17 MED ORDER — PROPOFOL 500 MG/50ML IV EMUL
INTRAVENOUS | Status: DC | PRN
  Administered 2016-02-17: 150 ug/kg/min via INTRAVENOUS

## 2016-02-17 MED ORDER — KETOROLAC TROMETHAMINE 30 MG/ML IJ SOLN
INTRAMUSCULAR | Status: DC | PRN
  Administered 2016-02-17: 30 mg via INTRAVENOUS

## 2016-02-17 MED ORDER — ONDANSETRON HCL 4 MG/2ML IJ SOLN
INTRAMUSCULAR | Status: DC | PRN
  Administered 2016-02-17: 4 mg via INTRAVENOUS

## 2016-02-17 MED ORDER — FAMOTIDINE 20 MG PO TABS
20.0000 mg | ORAL_TABLET | Freq: Once | ORAL | Status: AC
Start: 2016-02-17 — End: 2016-02-17
  Administered 2016-02-17: 20 mg via ORAL

## 2016-02-17 MED ORDER — FENTANYL CITRATE (PF) 100 MCG/2ML IJ SOLN: 25.0000 ug | INTRAMUSCULAR | Status: DC | PRN

## 2016-02-17 MED ORDER — IOTHALAMATE MEGLUMINE 43 % IV SOLN
INTRAVENOUS | Status: DC | PRN
Start: 2016-02-17 — End: 2016-02-17
  Administered 2016-02-17: 5 mL

## 2016-02-17 MED ORDER — DEXTROSE-NACL 5-0.45 % IV SOLN: INTRAVENOUS | Status: DC

## 2016-02-17 MED ORDER — MIDAZOLAM HCL 2 MG/2ML IJ SOLN
INTRAMUSCULAR | Status: DC | PRN
  Administered 2016-02-17: 2 mg via INTRAVENOUS

## 2016-02-17 MED ORDER — OXYCODONE-ACETAMINOPHEN 5-325 MG PO TABS: 1.0000 | ORAL_TABLET | ORAL | 0 refills | Status: DC | PRN

## 2016-02-17 MED ORDER — OXYBUTYNIN CHLORIDE 5 MG PO TABS: 5.0000 mg | ORAL_TABLET | Freq: Three times a day (TID) | ORAL | 0 refills | Status: DC | PRN

## 2016-02-17 MED ORDER — PROPOFOL 10 MG/ML IV BOLUS
INTRAVENOUS | Status: DC | PRN
  Administered 2016-02-17: 40 mg via INTRAVENOUS

## 2016-02-17 MED ORDER — LACTATED RINGERS IV SOLN
INTRAVENOUS | Status: DC
  Administered 2016-02-17: 07:00:00 via INTRAVENOUS

## 2016-02-17 MED ORDER — ONDANSETRON HCL 4 MG/2ML IJ SOLN: 4.0000 mg | Freq: Once | INTRAMUSCULAR | Status: DC | PRN

## 2016-02-17 MED ORDER — FAMOTIDINE 20 MG PO TABS
ORAL_TABLET | ORAL | Status: AC
  Administered 2016-02-17: 20 mg via ORAL
  Filled 2016-02-17: qty 1

## 2016-02-17 MED ORDER — LIDOCAINE HCL 2 % EX GEL
CUTANEOUS | Status: AC
  Filled 2016-02-17: qty 10

## 2016-02-17 MED ORDER — OXYCODONE-ACETAMINOPHEN 5-325 MG PO TABS
1.0000 | ORAL_TABLET | ORAL | Status: DC | PRN
  Administered 2016-02-17: 1 via ORAL

## 2016-02-17 MED ORDER — OXYCODONE-ACETAMINOPHEN 5-325 MG PO TABS
ORAL_TABLET | ORAL | Status: DC
Start: 2016-02-17 — End: 2016-02-17
  Filled 2016-02-17: qty 1

## 2016-02-17 MED ORDER — DOCUSATE SODIUM 100 MG PO CAPS: 100.0000 mg | ORAL_CAPSULE | Freq: Two times a day (BID) | ORAL | 0 refills | Status: AC

## 2016-02-17 MED ORDER — CEFAZOLIN SODIUM-DEXTROSE 2-4 GM/100ML-% IV SOLN
INTRAVENOUS | Status: AC
  Filled 2016-02-17: qty 100

## 2016-02-17 MED ORDER — TAMSULOSIN HCL 0.4 MG PO CAPS: 0.4000 mg | ORAL_CAPSULE | Freq: Every day | ORAL | 0 refills | Status: DC

## 2016-02-17 MED ORDER — CEFAZOLIN SODIUM-DEXTROSE 2-4 GM/100ML-% IV SOLN: 2.0000 g | INTRAVENOUS | Status: DC

## 2016-02-17 SURGICAL SUPPLY — 21 items
BAG DRAIN CYSTO-URO LG1000N (MISCELLANEOUS) ×3 IMPLANT
CATH FOL 2WAY LX 16X5 (CATHETERS) IMPLANT
CATH URETL 5X70 OPEN END (CATHETERS) ×3 IMPLANT
CONRAY 43 FOR UROLOGY 50M (MISCELLANEOUS) ×3 IMPLANT
GLOVE BIO SURGEON STRL SZ 6.5 (GLOVE) ×4 IMPLANT
GLOVE BIO SURGEONS STRL SZ 6.5 (GLOVE) ×2
GOWN STRL REUS W/ TWL LRG LVL4 (GOWN DISPOSABLE) ×2 IMPLANT
GOWN STRL REUS W/TWL LRG LVL4 (GOWN DISPOSABLE) ×4
HOLDER FOLEY CATH W/STRAP (MISCELLANEOUS) IMPLANT
KIT RM TURNOVER CYSTO AR (KITS) ×3 IMPLANT
PACK CYSTO AR (MISCELLANEOUS) ×3 IMPLANT
SENSORWIRE 0.038 NOT ANGLED (WIRE) ×3
SET CYSTO W/LG BORE CLAMP LF (SET/KITS/TRAYS/PACK) ×3 IMPLANT
SOL .9 NS 3000ML IRR  AL (IV SOLUTION) ×2
SOL .9 NS 3000ML IRR UROMATIC (IV SOLUTION) ×1 IMPLANT
STENT URET 6FRX24 CONTOUR (STENTS) IMPLANT
STENT URET 6FRX26 CONTOUR (STENTS) ×3 IMPLANT
SURGILUBE 2OZ TUBE FLIPTOP (MISCELLANEOUS) ×3 IMPLANT
SYRINGE IRR TOOMEY STRL 70CC (SYRINGE) ×3 IMPLANT
WATER STERILE IRR 1000ML POUR (IV SOLUTION) ×3 IMPLANT
WIRE SENSOR 0.038 NOT ANGLED (WIRE) ×1 IMPLANT

## 2016-02-17 NOTE — Anesthesia Postprocedure Evaluation (Signed)
Anesthesia Post Note  Patient: Trey SailorsChristopher R Archbold  Procedure(s) Performed: Procedure(s) (LRB): CYSTOSCOPY WITH STENT PLACEMENT (Left)  Anesthesia Post Evaluation  Last Vitals:  Vitals:   02/17/16 0820 02/17/16 0835  BP: 107/74 110/74  Pulse: (!) 54 (!) 52  Resp: 16 17  Temp:      Last Pain:  Vitals:   02/17/16 0611  TempSrc: Tympanic                 VAN STAVEREN,Alyus Mofield

## 2016-02-17 NOTE — Transfer of Care (Signed)
Immediate Anesthesia Transfer of Care Note  Patient: Jordan Mcpherson  Procedure(s) Performed: Procedure(s): CYSTOSCOPY WITH STENT PLACEMENT (Left)  Patient Location: PACU  Anesthesia Type:General  Level of Consciousness: awake and alert   Airway & Oxygen Therapy: Patient Spontanous Breathing and Patient connected to nasal cannula oxygen  Post-op Assessment: Report given to RN, Post -op Vital signs reviewed and stable and Patient moving all extremities X 4  Post vital signs: Reviewed and stable  Last Vitals:  Vitals:   02/17/16 0614 02/17/16 0805  BP: 131/88 (!) 104/57  Pulse: 70   Resp: 16   Temp: 36.4 C 36.3 C    Last Pain:  Vitals:   02/17/16 0611  TempSrc: Tympanic         Complications: No apparent anesthesia complications

## 2016-02-17 NOTE — Anesthesia Preprocedure Evaluation (Signed)
Anesthesia Evaluation  Patient identified by MRN, date of birth, ID band Patient awake    Reviewed: Allergy & Precautions, NPO status , Patient's Chart, lab work & pertinent test results  Airway Mallampati: II       Dental  (+) Teeth Intact   Pulmonary neg pulmonary ROS, Current Smoker,    breath sounds clear to auscultation       Cardiovascular Exercise Tolerance: Good  Rhythm:Regular     Neuro/Psych Anxiety Depression negative neurological ROS     GI/Hepatic negative GI ROS, Neg liver ROS,   Endo/Other    Renal/GU negative Renal ROS     Musculoskeletal   Abdominal Normal abdominal exam  (+)   Peds negative pediatric ROS (+)  Hematology negative hematology ROS (+)   Anesthesia Other Findings   Reproductive/Obstetrics                             Anesthesia Physical Anesthesia Plan  ASA: II  Anesthesia Plan: MAC   Post-op Pain Management:    Induction: Intravenous  Airway Management Planned: Natural Airway and Nasal Cannula  Additional Equipment:   Intra-op Plan:   Post-operative Plan:   Informed Consent: I have reviewed the patients History and Physical, chart, labs and discussed the procedure including the risks, benefits and alternatives for the proposed anesthesia with the patient or authorized representative who has indicated his/her understanding and acceptance.     Plan Discussed with: CRNA  Anesthesia Plan Comments:         Anesthesia Quick Evaluation

## 2016-02-17 NOTE — Op Note (Signed)
Date of procedure: 02/17/16  Preoperative diagnosis:  1. Left nephrolithiasis   Postoperative diagnosis:  1. same   Procedure: 1. Cystoscopy 2. Left retrograde pyelogram 3. Left ureteral stent placement  Surgeon: Vanna ScotlandAshley Kalliope Riesen, MD  Anesthesia: MAC  Complications: None  Intraoperative findings: 8 mm renal pelvic stone identified on fluoroscopy  EBL: Minimal  Specimens: None  Drains: 6 x 26 French double-J ureteral stent on left  Indication: Jordan Mcpherson is a 25 y.o. patient with a history of nephrolithiasis who previously underwent multiple staged procedures for his right-sided stones. Due his history of the Steinstrasse and narrow ureter, he elected to undergo pre-ESWL left ureteral stent placement.  After reviewing the management options for treatment, he elected to proceed with the above surgical procedure(s). We have discussed the potential benefits and risks of the procedure, side effects of the proposed treatment, the likelihood of the patient achieving the goals of the procedure, and any potential problems that might occur during the procedure or recuperation. Informed consent has been obtained.  Description of procedure:  The patient was taken to the operating room and MAC was administered.  The patient was placed in the dorsal lithotomy position, prepped and draped in the usual sterile fashion, and preoperative antibiotics were administered. A preoperative time-out was performed.   A 21 French scope was advanced per urethra into the bladder. Attention was turned to the left ureteral orifice which was cannulated using a 5 [place] open-ended ureteral catheter. Gentle retrograde pyelogram was performed on this side using 5 cc of contrast solution. This showed a delicate appearing ureter and a decompressed collecting system. Prior to shooting the retrograde, the stone shadow in the left renal pelvis could be identified. Next, a sensor wire was advanced up to level of  the kidney without difficulty. A 6 x 26 French double-J ureteral stent was advanced over the wire up to the level of the renal pelvis. The wire was partially withdrawn until full coil was noted within the renal pelvis. The wire was then fully withdrawn and a full coil was noted within the bladder. The bladder was then drained and the scope was removed. He was repositioned the supine position, reversed from anesthesia, taken to the PACU in stable condition.  Plan: Patient will be recovered in the PACU and then ESWL will be performed later today.  Vanna ScotlandAshley Kate Larock, M.D.

## 2016-02-17 NOTE — H&P (Signed)
12/22/2015 --> updated 02/17/16 without change 3:02 PM   Jordan Mcpherson May 17, 1990 [id number]  Referring provider: No referring provider defined for this encounter.      Chief Complaint  Patient presents with  . Follow-up    RUS Results    HPI: 25 year old male with bilateral nephrolithiasis status post right ureteroscopy/laser lithotripsy  for a 5 mm right proximal ureteral stone on 11/22/2015.  This is performed as a staged procedure due to ureteral narrowing requiring ureteral stent placement for passive dilation.  Follow-up renal ultrasound today shows no residual stones or hydronephrosis on this side.  He does have an 8 mm left mid pole calculus (measured 12 mm on RUS) which remains untreated.  Today, he has no complaints. He is able to remove the stent without difficulty. He is anxious to have this left-sided stone treated due to the size and concern for impending obstruction. He has a new job and is very hesitant to schedule a procedure as he is worried about leaving or missing work. He states that he would prefer to intervene on this stone some time in October once he's been at his job for 3 months.  He does have a personal history of nephrolithiasis. He passed 2 stones spontaneously in the past. Stone composition is unknown.  He does try to drink plenty of water and his cut back on sodas.  PMH:     Past Medical History:  Diagnosis Date  . Anxiety   . Depression   . GERD (gastroesophageal reflux disease)    very rare-no meds  . Hx MRSA infection   . Kidney stones   . Renal disorder     Surgical History:      Past Surgical History:  Procedure Laterality Date  . CYSTOSCOPY/URETEROSCOPY/HOLMIUM LASER/STENT PLACEMENT Right 11/15/2015   Procedure: CYSTOSCOPY/URETEROSCOPY/STENT PLACEMENT;  Surgeon: Vanna Scotland, MD;  Location: [facility] ORS;  Service: Urology;  Laterality: Right;  . CYSTOSCOPY/URETEROSCOPY/HOLMIUM LASER/STENT PLACEMENT Right  11/22/2015   Procedure: CYSTOSCOPY/URETEROSCOPY/HOLMIUM LASER/STENT EXCHANGE;  Surgeon: Vanna Scotland, MD;  Location: [facility] ORS;  Service: Urology;  Laterality: Right;  . NO PAST SURGERIES    . none    . PILONIDAL CYST DRAINAGE      Home Medications:        Medication List           Accurate as of 12/22/15 11:59 PM. Always use your most recent med list.           HYDROmorphone 2 MG tablet Commonly known as:  DILAUDID Take 1 tablet (2 mg total) by mouth every 6 (six) hours as needed for severe pain.  oxybutynin 5 MG tablet Commonly known as:  DITROPAN Take 1 tablet (5 mg total) by mouth every 8 (eight) hours as needed for bladder spasms.  oxyCODONE-acetaminophen 5-325 MG tablet Commonly known as:  ROXICET Take 1 tablet by mouth every 6 (six) hours as needed.  tamsulosin 0.4 MG Caps capsule Commonly known as:  FLOMAX Take 1 capsule (0.4 mg total) by mouth daily.      Allergies: No Known Allergies  Family History:      Family History  Problem Relation Age of Onset  . Kidney disease Neg Hx   . Kidney Stones Father   . Prostate cancer Maternal Uncle     Social History:  reports that he has been smoking Cigarettes.  He has a 3.50 pack-year smoking history. He has never used smokeless tobacco. He reports that he does not drink alcohol or use  drugs.  ROS: UROLOGY Frequent Urination?: No Hard to postpone urination?: No Burning/pain with urination?: No Get up at night to urinate?: No Leakage of urine?: No Urine stream starts and stops?: No Trouble starting stream?: No Do you have to strain to urinate?: No Blood in urine?: No Urinary tract infection?: No Sexually transmitted disease?: No Injury to kidneys or bladder?: No Painful intercourse?: No Weak stream?: No Erection problems?: No Penile pain?: No  Gastrointestinal Nausea?: No Vomiting?: No Indigestion/heartburn?: No Diarrhea?: No Constipation?: No  Constitutional Fever:  No Night sweats?: No Weight loss?: No Fatigue?: No  Skin Skin rash/lesions?: No Itching?: No  Eyes Blurred vision?: No Double vision?: No  Ears/Nose/Throat Sore throat?: No Sinus problems?: No  Hematologic/Lymphatic Swollen glands?: No Easy bruising?: No  Cardiovascular Leg swelling?: No Chest pain?: No  Respiratory Cough?: No Shortness of breath?: No  Endocrine Excessive thirst?: No  Musculoskeletal Back pain?: No Joint pain?: No  Neurological Headaches?: No Dizziness?: No  Psychologic Depression?: No Anxiety?: No  Physical Exam: BP 118/77 (BP Location: Left Arm, Patient Position: Sitting, Cuff Size: Large)   Pulse 84   Ht 5\' 11"  (1.803 m)   Wt 218 lb 9.6 oz (99.2 kg)   BMI 30.49 kg/m   Constitutional:  Alert and oriented, No acute distress.  Accompanied by ? Wife/ girlfriend today. HEENT: [place] AT, moist mucus membranes.  Trachea midline, no masses. Cardiovascular: No clubbing, cyanosis, or edema. RRR. Respiratory: Normal respiratory effort, no increased work of breathing. CTAB.   Skin: No rashes, bruises or suspicious lesions.. Neurologic: Grossly intact, no focal deficits, moving all 4 extremities. Psychiatric: Normal mood and affect.  Laboratory Data: RecentLabs       Lab Results  Component Value Date   WBC 11.8 (H) 10/31/2015   HGB 13.6 10/31/2015   HCT 40.0 10/31/2015   MCV 85.1 10/31/2015   PLT 200 10/31/2015      RecentLabs       Lab Results  Component Value Date   CREATININE 1.47 (H) 10/31/2015      Pertinent Imaging: Study Result   CLINICAL DATA: History of kidney stones. Flank pain.  EXAM: RENAL / URINARY TRACT ULTRASOUND COMPLETE  COMPARISON: 11/09/2015.  FINDINGS: Right Kidney:  Length: 11.6 cm. Echogenicity within normal limits. No mass or hydronephrosis visualized.  Left Kidney:  Length: 11.7 cm. Echogenicity within normal limits. No mass or hydronephrosis visualized. 1.2  cm nonobstructing left renal stone.  Bladder:  Appears normal for degree of bladder distention.  IMPRESSION: 1. 1.2 cm nonobstructing left renal stone.  2. Exam otherwise unremarkable.   Electronically Signed By: Maisie Fus Register On: 12/22/2015 13:43   RUS personally reviewed today and with the patient.  In addition, his previous CT scan was also reviewed.   Assessment & Plan:   1. Left nephrolithiasis Discussed plan moving forward to treat left nonobstructing stone.  We discussed various treatment options including ESWL vs. ureteroscopy, laser lithotripsy, and stent. We discussed the risks and benefits of both including bleeding, infection, damage to surrounding structures, efficacy with need for possible further intervention, and need for temporary ureteral stent.  He is very concerned about developing steinstrasse obstruction especially given that his right ureter was very narrow. He is anxious about missing work.  He is most interested shockwave lithotripsy but would prefer to have a ureteral stent placed on the morning of the procedure in order to avoid any stone impaction within the ureter and help facilitate stone passage. This seems very reasonable.  We will  arrange for the stent to be removed one to 2 weeks following the procedure in the office as long as there is no large stone fragments and follow-up KUB.  [place] Lions[place]ois questions were answered today. He would like to schedule this for early October.  2. Recurrent kidney stones We discussed with the patient will need a 24-hour urine metabolic workup on 2 stone free.  3. Acute kidney injury (HCC) Acute kidney injury noted at the time of urinary obstruction (Cr 1.47 from baseline 1.1). Suspect this is resolved now that he obstruction is relieved. We'll recheck at a later date.   Schedule Left ureteral stent placement, left ESWL  Vanna ScotlandAshley Mekaylah Klich, MD  [facility] [address],  [address] [place], [place] [address] [phone]

## 2016-02-17 NOTE — Discharge Instructions (Signed)
Kidney Stones Kidney stones (urolithiasis) are deposits that form inside your kidneys. The intense pain is caused by the stone moving through the urinary tract. When the stone moves, the ureter goes into spasm around the stone. The stone is usually passed in the urine.  CAUSES   A disorder that makes certain neck glands produce too much parathyroid hormone (primary hyperparathyroidism).  A buildup of uric acid crystals, similar to gout in your joints.  Narrowing (stricture) of the ureter.  A kidney obstruction present at birth (congenital obstruction).  Previous surgery on the kidney or ureters.  Numerous kidney infections. SYMPTOMS   Feeling sick to your stomach (nauseous).  Throwing up (vomiting).  Blood in the urine (hematuria).  Pain that usually spreads (radiates) to the groin.  Frequency or urgency of urination. DIAGNOSIS   Taking a history and physical exam.  Blood or urine tests.  CT scan.  Occasionally, an examination of the inside of the urinary bladder (cystoscopy) is performed. TREATMENT   Observation.  Increasing your fluid intake.  Extracorporeal shock wave lithotripsy--This is a noninvasive procedure that uses shock waves to break up kidney stones.  Surgery may be needed if you have severe pain or persistent obstruction. There are various surgical procedures. Most of the procedures are performed with the use of small instruments. Only small incisions are needed to accommodate these instruments, so recovery time is minimized. The size, location, and chemical composition are all important variables that will determine the proper choice of action for you. Talk to your health care provider to better understand your situation so that you will minimize the risk of injury to yourself and your kidney.  HOME CARE INSTRUCTIONS   Drink enough water and fluids to keep your urine clear or pale yellow. This will help you to pass the stone or stone  fragments.  Strain all urine through the provided strainer. Keep all particulate matter and stones for your health care provider to see. The stone causing the pain may be as small as a grain of salt. It is very important to use the strainer each and every time you pass your urine. The collection of your stone will allow your health care provider to analyze it and verify that a stone has actually passed. The stone analysis will often identify what you can do to reduce the incidence of recurrences.  Only take over-the-counter or prescription medicines for pain, discomfort, or fever as directed by your health care provider.  Keep all follow-up visits as told by your health care provider. This is important.  Get follow-up X-rays if required. The absence of pain does not always mean that the stone has passed. It may have only stopped moving. If the urine remains completely obstructed, it can cause loss of kidney function or even complete destruction of the kidney. It is your responsibility to make sure X-rays and follow-ups are completed. Ultrasounds of the kidney can show blockages and the status of the kidney. Ultrasounds are not associated with any radiation and can be performed easily in a matter of minutes.  Make changes to your daily diet as told by your health care provider. You may be told to:  Limit the amount of salt that you eat.  Eat 5 or more servings of fruits and vegetables each day.  Limit the amount of meat, poultry, fish, and eggs that you eat.  Collect a 24-hour urine sample as told by your health care provider.You may need to collect another urine sample every  6-12 months. SEEK MEDICAL CARE IF:  You experience pain that is progressive and unresponsive to any pain medicine you have been prescribed. SEEK IMMEDIATE MEDICAL CARE IF:   Pain cannot be controlled with the prescribed medicine.  You have a fever or shaking chills.  The severity or intensity of pain increases over  18 hours and is not relieved by pain medicine.  You develop a new onset of abdominal pain.  You feel faint or pass out.  You are unable to urinate.   This information is not intended to replace advice given to you by your health care provider. Make sure you discuss any questions you have with your health care provider.   Document Released: 05/01/2005 Document Revised: 01/20/2015 Document Reviewed: 10/02/2012 Elsevier Interactive Patient Education 2016 Elsevier Inc. AMBULATORY SURGERY  DISCHARGE INSTRUCTIONS   1) The drugs that you were given will stay in your system until tomorrow so for the next 24 hours you should not:  A) Drive an automobile B) Make any legal decisions C) Drink any alcoholic beverage   2) You may resume regular meals tomorrow.  Today it is better to start with liquids and gradually work up to solid foods.  You may eat anything you prefer, but it is better to start with liquids, then soup and crackers, and gradually work up to solid foods.   3) Please notify your doctor immediately if you have any unusual bleeding, trouble breathing, redness and pain at the surgery site, drainage, fever, or pain not relieved by medication.    4) Additional Instructions:        Please contact your physician with any problems or Same Day Surgery at [phone], Monday through Friday 6 am to 4 pm, or [place] at [facility] Main number at [phone].

## 2016-02-17 NOTE — Progress Notes (Signed)
After stent placement, the patient was brought down to the lithotripsy truck. Unfortunately, Toradol was administered which is contraindicated for ESWL. As such, we were able to complete this treatment.  We will try to arrange for this to be rescheduled hopefully on Monday in [place] and if not possible, Thursday here in [place].  Situation was explained to the patient in detail and he understands and is agreeable with the plan.  Vanna ScotlandAshley Marlea Gambill, MD

## 2016-02-17 NOTE — Anesthesia Postprocedure Evaluation (Signed)
Anesthesia Post Note  Patient: Jordan SailorsChristopher R Mcpherson  Procedure(s) Performed: Procedure(s) (LRB): CYSTOSCOPY WITH STENT PLACEMENT (Left)  Patient location during evaluation: PACU Anesthesia Type: General Level of consciousness: awake Pain management: pain level controlled Vital Signs Assessment: post-procedure vital signs reviewed and stable Respiratory status: spontaneous breathing Cardiovascular status: stable Anesthetic complications: no    Last Vitals:  Vitals:   02/17/16 0820 02/17/16 0835  BP: 107/74 110/74  Pulse: (!) 54 (!) 52  Resp: 16 17  Temp:      Last Pain:  Vitals:   02/17/16 0611  TempSrc: Tympanic                 VAN STAVEREN,Leodan Bolyard

## 2016-02-18 ENCOUNTER — Other Ambulatory Visit: Payer: Self-pay | Admitting: Urology

## 2016-02-18 ENCOUNTER — Telehealth: Payer: Self-pay | Admitting: Urology

## 2016-02-18 ENCOUNTER — Encounter: Payer: Self-pay | Admitting: Urology

## 2016-02-18 NOTE — Telephone Encounter (Signed)
Pt called and wanted to know if he was going to be seen at [facility] or [facility].  Can you please give pt a call [phone] or [phone]  He wants to know so he can let his employer know.

## 2016-02-21 ENCOUNTER — Encounter (HOSPITAL_COMMUNITY): Payer: Self-pay | Admitting: *Deleted

## 2016-02-21 ENCOUNTER — Ambulatory Visit (HOSPITAL_COMMUNITY)
Admission: RE | Admit: 2016-02-21 | Discharge: 2016-02-21 | Disposition: A | Discharge status: Home / Self Care | Payer: 59 | Source: Ambulatory Visit | Attending: Urology | Admitting: Urology

## 2016-02-21 ENCOUNTER — Encounter (HOSPITAL_COMMUNITY)
Admission: RE | Disposition: A | Discharge status: Home / Self Care | Payer: Self-pay | Source: Ambulatory Visit | Attending: Urology

## 2016-02-21 DIAGNOSIS — Z841 Family history of disorders of kidney and ureter: Secondary | ICD-10-CM | POA: Insufficient documentation

## 2016-02-21 DIAGNOSIS — Z8614 Personal history of Methicillin resistant Staphylococcus aureus infection: Secondary | ICD-10-CM | POA: Diagnosis not present

## 2016-02-21 DIAGNOSIS — N200 Calculus of kidney: Secondary | ICD-10-CM | POA: Insufficient documentation

## 2016-02-21 DIAGNOSIS — Z87442 Personal history of urinary calculi: Secondary | ICD-10-CM | POA: Insufficient documentation

## 2016-02-21 DIAGNOSIS — F419 Anxiety disorder, unspecified: Secondary | ICD-10-CM | POA: Insufficient documentation

## 2016-02-21 DIAGNOSIS — F17210 Nicotine dependence, cigarettes, uncomplicated: Secondary | ICD-10-CM | POA: Diagnosis not present

## 2016-02-21 DIAGNOSIS — F329 Major depressive disorder, single episode, unspecified: Secondary | ICD-10-CM | POA: Diagnosis not present

## 2016-02-21 DIAGNOSIS — Z8042 Family history of malignant neoplasm of prostate: Secondary | ICD-10-CM | POA: Insufficient documentation

## 2016-02-21 DIAGNOSIS — Z79899 Other long term (current) drug therapy: Secondary | ICD-10-CM | POA: Insufficient documentation

## 2016-02-21 SURGERY — LITHOTRIPSY, ESWL
Anesthesia: LOCAL | Laterality: Left

## 2016-02-21 MED ORDER — DIPHENHYDRAMINE HCL 25 MG PO CAPS
25.0000 mg | ORAL_CAPSULE | ORAL | Status: AC
  Administered 2016-02-21: 25 mg via ORAL
  Filled 2016-02-21: qty 1

## 2016-02-21 MED ORDER — SODIUM CHLORIDE 0.9 % IV SOLN
INTRAVENOUS | Status: DC
  Administered 2016-02-21: 09:00:00 via INTRAVENOUS

## 2016-02-21 MED ORDER — CIPROFLOXACIN HCL 500 MG PO TABS
500.0000 mg | ORAL_TABLET | ORAL | Status: AC
  Administered 2016-02-21: 500 mg via ORAL
  Filled 2016-02-21: qty 1

## 2016-02-21 MED ORDER — DIAZEPAM 5 MG PO TABS
10.0000 mg | ORAL_TABLET | ORAL | Status: AC
  Administered 2016-02-21: 10 mg via ORAL
  Filled 2016-02-21: qty 2

## 2016-02-21 MED ORDER — OXYCODONE-ACETAMINOPHEN 5-325 MG PO TABS
1.0000 | ORAL_TABLET | ORAL | Status: AC
  Administered 2016-02-21: 1 via ORAL
  Filled 2016-02-21: qty 1

## 2016-02-21 NOTE — Discharge Instructions (Signed)
Follow [facility] discharge instructions sheet.  Moderate Conscious Sedation, Adult, Care After Refer to this sheet in the next few weeks. These instructions provide you with information on caring for yourself after your procedure. Your health care provider may also give you more specific instructions. Your treatment has been planned according to current medical practices, but problems sometimes occur. Call your health care provider if you have any problems or questions after your procedure. WHAT TO EXPECT AFTER THE PROCEDURE  After your procedure:  You may feel sleepy, clumsy, and have poor balance for several hours.  Vomiting may occur if you eat too soon after the procedure. HOME CARE INSTRUCTIONS  Do not participate in any activities where you could become injured for at least 24 hours. Do not:  Drive.  Swim.  Ride a bicycle.  Operate heavy machinery.  Cook.  Use power tools.  Climb ladders.  Work from a high place.  Do not make important decisions or sign legal documents until you are improved.  If you vomit, drink water, juice, or soup when you can drink without vomiting. Make sure you have little or no nausea before eating solid foods.  Only take over-the-counter or prescription medicines for pain, discomfort, or fever as directed by your health care provider.  Make sure you and your family fully understand everything about the medicines given to you, including what side effects may occur.  You should not drink alcohol, take sleeping pills, or take medicines that cause drowsiness for at least 24 hours.  If you smoke, do not smoke without supervision.  If you are feeling better, you may resume normal activities 24 hours after you were sedated.  Keep all appointments with your health care provider. SEEK MEDICAL CARE IF:  Your skin is pale or bluish in color.  You continue to feel nauseous or vomit.  Your pain is getting worse and is not helped by  medicine.  You have bleeding or swelling.  You are still sleepy or feeling clumsy after 24 hours. SEEK IMMEDIATE MEDICAL CARE IF:  You develop a rash.  You have difficulty breathing.  You develop any type of allergic problem.  You have a fever. MAKE SURE YOU:  Understand these instructions.  Will watch your condition.  Will get help right away if you are not doing well or get worse.   This information is not intended to replace advice given to you by your health care provider. Make sure you discuss any questions you have with your health care provider.   Document Released: 02/19/2013 Document Revised: 05/22/2014 Document Reviewed: 02/19/2013 Elsevier Interactive Patient Education [employer]2016 Elsevier Inc.

## 2016-02-23 ENCOUNTER — Emergency Department
Admission: EM | Admit: 2016-02-23 | Discharge: 2016-02-23 | Disposition: A | Discharge status: Home / Self Care | Payer: 59 | Attending: Emergency Medicine | Admitting: Emergency Medicine

## 2016-02-23 ENCOUNTER — Encounter: Payer: Self-pay | Admitting: Emergency Medicine

## 2016-02-23 DIAGNOSIS — Y929 Unspecified place or not applicable: Secondary | ICD-10-CM | POA: Diagnosis not present

## 2016-02-23 DIAGNOSIS — Y69 Unspecified misadventure during surgical and medical care: Secondary | ICD-10-CM | POA: Insufficient documentation

## 2016-02-23 DIAGNOSIS — X500XXA Overexertion from strenuous movement or load, initial encounter: Secondary | ICD-10-CM | POA: Diagnosis not present

## 2016-02-23 DIAGNOSIS — Y9389 Activity, other specified: Secondary | ICD-10-CM | POA: Diagnosis not present

## 2016-02-23 DIAGNOSIS — F17210 Nicotine dependence, cigarettes, uncomplicated: Secondary | ICD-10-CM | POA: Insufficient documentation

## 2016-02-23 DIAGNOSIS — Z791 Long term (current) use of non-steroidal anti-inflammatories (NSAID): Secondary | ICD-10-CM | POA: Insufficient documentation

## 2016-02-23 DIAGNOSIS — T8384XA Pain from genitourinary prosthetic devices, implants and grafts, initial encounter: Secondary | ICD-10-CM | POA: Diagnosis not present

## 2016-02-23 DIAGNOSIS — R109 Unspecified abdominal pain: Secondary | ICD-10-CM | POA: Diagnosis present

## 2016-02-23 DIAGNOSIS — R319 Hematuria, unspecified: Secondary | ICD-10-CM

## 2016-02-23 DIAGNOSIS — Y990 Civilian activity done for income or pay: Secondary | ICD-10-CM | POA: Diagnosis not present

## 2016-02-23 LAB — URINALYSIS COMPLETE WITH MICROSCOPIC (ARMC ONLY)
BILIRUBIN URINE: NEGATIVE
Bacteria, UA: NONE SEEN
Glucose, UA: NEGATIVE mg/dL
KETONES UR: NEGATIVE mg/dL
NITRITE: NEGATIVE
PH: 6 (ref 5.0–8.0)
PROTEIN: 100 mg/dL — AB
SPECIFIC GRAVITY, URINE: 1.014 (ref 1.005–1.030)
Squamous Epithelial / LPF: NONE SEEN

## 2016-02-23 LAB — BASIC METABOLIC PANEL
ANION GAP: 10 (ref 5–15)
BUN: 16 mg/dL (ref 6–20)
CHLORIDE: 101 mmol/L (ref 101–111)
CO2: 25 mmol/L (ref 22–32)
Calcium: 9.3 mg/dL (ref 8.9–10.3)
Creatinine, Ser: 1.17 mg/dL (ref 0.61–1.24)
GFR calc non Af Amer: >60 mL/min (ref >60)
Glucose, Bld: 116 mg/dL — ABNORMAL HIGH (ref 65–99)
Potassium: 3.6 mmol/L (ref 3.5–5.1)
SODIUM: 136 mmol/L (ref 135–145)

## 2016-02-23 LAB — CBC
HCT: 44.4 % (ref 40.0–52.0)
Hemoglobin: 15.5 g/dL (ref 13.0–18.0)
MCH: 29.4 pg (ref 26.0–34.0)
MCHC: 34.9 g/dL (ref 32.0–36.0)
MCV: 84.2 fL (ref 80.0–100.0)
Platelets: 234 10*3/uL (ref 150–440)
RBC: 5.27 MIL/uL (ref 4.40–5.90)
RDW: 12.5 % (ref 11.5–14.5)
WBC: 11.6 10*3/uL — AB (ref 3.8–10.6)

## 2016-02-23 NOTE — ED Provider Notes (Signed)
[facility] Emergency Department Provider Note   ____________________________________________   First MD Initiated Contact with Patient 02/23/16 2134     (approximate)  I have reviewed the triage vital signs and the nursing notes.   HISTORY  Chief Complaint Abdominal Pain   HPI Jordan Mcpherson is a 25 y.o. male with a history of kidney stones status post left ureteral stenting this past Thursday and lithotripsy this past Monday was presenting with left flank pain. He went back to work today and did heavy lifting. After heavy lifting he began having left flank pain with hematuria. He says that his pain is a 5 out of 10 now and not associated with any nausea or vomiting. Says the pain is radiating down into his penis and he feels like his urine at a time when he does he only has a small amount of urine comes out. He also notes a small amount of clot that he urinated earlier in the emergency department.He is compliant with his Flomax only takes his Percocet as needed.   Past Medical History:  Diagnosis Date  . Anxiety   . Depression   . GERD (gastroesophageal reflux disease)    very rare-no meds  . Hx MRSA infection   . Kidney stones   . Renal disorder     There are no active problems to display for this patient.   Past Surgical History:  Procedure Laterality Date  . CYSTOSCOPY WITH STENT PLACEMENT Left 02/17/2016   Procedure: CYSTOSCOPY WITH STENT PLACEMENT;  Surgeon: Vanna ScotlandAshley Brandon, MD;  Location: [facility] ORS;  Service: Urology;  Laterality: Left;  . CYSTOSCOPY/URETEROSCOPY/HOLMIUM LASER/STENT PLACEMENT Right 11/15/2015   Procedure: CYSTOSCOPY/URETEROSCOPY/STENT PLACEMENT;  Surgeon: Vanna ScotlandAshley Brandon, MD;  Location: [facility] ORS;  Service: Urology;  Laterality: Right;  . CYSTOSCOPY/URETEROSCOPY/HOLMIUM LASER/STENT PLACEMENT Right 11/22/2015   Procedure: CYSTOSCOPY/URETEROSCOPY/HOLMIUM LASER/STENT EXCHANGE;  Surgeon: Vanna ScotlandAshley Brandon, MD;  Location: [facility] ORS;   Service: Urology;  Laterality: Right;  . EXTRACORPOREAL SHOCK WAVE LITHOTRIPSY Left 02/17/2016   Was not done because patient had toradol in recovery  . none    . PILONIDAL CYST DRAINAGE      Prior to Admission medications   Medication Sig Start Date End Date Taking? Authorizing Provider  docusate sodium (COLACE) 100 MG capsule Take 1 capsule (100 mg total) by mouth 2 (two) times daily. 02/17/16   Vanna ScotlandAshley Brandon, MD  oxybutynin (DITROPAN) 5 MG tablet Take 1 tablet (5 mg total) by mouth every 8 (eight) hours as needed for bladder spasms. 02/17/16   Vanna ScotlandAshley Brandon, MD  oxyCODONE-acetaminophen (PERCOCET) 5-325 MG tablet Take 1-2 tablets by mouth every 4 (four) hours as needed for moderate pain or severe pain. 02/17/16   Vanna ScotlandAshley Brandon, MD  tamsulosin (FLOMAX) 0.4 MG CAPS capsule Take 1 capsule (0.4 mg total) by mouth daily. 02/17/16   Vanna ScotlandAshley Brandon, MD    Allergies Review of patient's allergies indicates no known allergies.  Family History  Problem Relation Age of Onset  . Kidney Stones Father   . Prostate cancer Maternal Uncle   . Kidney disease Neg Hx     Social History Social History  Substance Use Topics  . Smoking status: Current Every Day Smoker    Packs/day: 0.50    Years: 7.00    Types: Cigarettes  . Smokeless tobacco: Never Used  . Alcohol use No    Review of Systems Constitutional: No fever/chills Eyes: No visual changes. ENT: No sore throat. Cardiovascular: Denies chest pain. Respiratory: Denies shortness of breath.  Gastrointestinal: No abdominal pain.  No nausea, no vomiting.  No diarrhea.  No constipation. Genitourinary: Negative for dysuria. Musculoskeletal: Negative for back pain. Skin: Was flushed when he arrived to the emergency department with erythema to both his bilateral upper 70s as well as his face. His family, who is at the bedside, saying that the rash is improving at this time. Neurological: Negative for headaches, focal weakness or  numbness.  10-point ROS otherwise negative.  ____________________________________________   PHYSICAL EXAM:  VITAL SIGNS: ED Triage Vitals [02/23/16 1951]  Enc Vitals Group     BP 135/81     Pulse Rate 85     Resp 18     Temp 98.5 F (36.9 C)     Temp Source Oral     SpO2 97 %     Weight 218 lb (98.9 kg)     Height 5\' 11"  (1.803 m)     Head Circumference      Peak Flow      Pain Score 5     Pain Loc      Pain Edu?      Excl. in GC?     Constitutional: Alert and oriented. Well appearing and in no acute distress. Eyes: Conjunctivae are normal. PERRL. EOMI. Head: Atraumatic. Nose: No congestion/rhinnorhea. Mouth/Throat: Mucous membranes are moist.   Neck: No stridor.   Cardiovascular: Normal rate, regular rhythm. Grossly normal heart sounds.  Good peripheral circulation. Respiratory: Normal respiratory effort.  No retractions. Lungs CTAB. Gastrointestinal: Soft with mild to moderate left lower quadrant tenderness to palpation. No distention. No CVA tenderness. Musculoskeletal: No lower extremity tenderness nor edema.  No joint effusions. Neurologic:  Normal speech and language. No gross focal neurologic deficits are appreciated.  Skin:  Skin is warm, dry and intact. No rash noted. Psychiatric: Mood and affect are normal. Speech and behavior are normal.  ____________________________________________   LABS (all labs ordered are listed, but only abnormal results are displayed)  Labs Reviewed  CBC - Abnormal; Notable for the following:       Result Value   WBC 11.6 (*)    All other components within normal limits  BASIC METABOLIC PANEL - Abnormal; Notable for the following:    Glucose, Bld 116 (*)    All other components within normal limits  URINALYSIS COMPLETEWITH MICROSCOPIC ([facility] ONLY) - Abnormal; Notable for the following:    Color, Urine RED (*)    APPearance CLOUDY (*)    Hgb urine dipstick 3+ (*)    Protein, ur 100 (*)    Leukocytes, UA 1+ (*)    All  other components within normal limits   ____________________________________________  EKG   ____________________________________________  RADIOLOGY   ____________________________________________   PROCEDURES  Procedure(s) performed:   Procedures  Critical Care performed:   ____________________________________________   INITIAL IMPRESSION / ASSESSMENT AND PLAN / ED COURSE  Pertinent labs & imaging results that were available during my care of the patient were reviewed by me and considered in my medical decision making (see chart for details).  ----------------------------------------- 10:48 PM on 02/23/2016 -----------------------------------------  Discussed the case with Dr. Apolinar Junes who performed the stenting. She says that the physical sounds consistent with stent irritation and as long as the patient is not obstructing and his hemoglobin is normal that there is no intervention necessary. I discussed further normal hemoglobin. We will bladder scan the patient and as long as he has not in retention he'll be discharged home with follow-up with urology.  Clinical Course   ----------------------------------------- 11:02 PM on 02/23/2016 -----------------------------------------  Patient with 9 cc of urine in the bladder. Able to urinate well just prior to the reexamination. Appears to be stent irritation. It was his first day back at work today. We discussed staying hydrated and the patient will also be taking tomorrow off work again. We discussed light duty and the patient says that he will do his best but does a lot of heavy lifting at his job and may not be able to avoid this. He'll be following up with his urologist, Dr. Apolinar Junes.  ____________________________________________   FINAL CLINICAL IMPRESSION(S) / ED DIAGNOSES  Left flank pain. Stent irritation. Hematuria.    NEW MEDICATIONS STARTED DURING THIS VISIT:  New Prescriptions   No medications on file      Note:  This document was prepared using Dragon voice recognition software and may include unintentional dictation errors.    Myrna Blazer, MD 02/23/16 [phone]

## 2016-02-23 NOTE — ED Notes (Signed)
Patient is complaining of abdominal pain from left upper quadrant radiating down into his penis.  Patient reports dizziness and nausea today.  Patient states pain began today after patient took his flomax.  Patient had a stent placed in his left kidney on October 5th and lithotripsy yesterday. Patient has a red rash to bilateral arms and face.

## 2016-02-23 NOTE — ED Triage Notes (Signed)
Patient ambulatory to triage with steady gait, without difficulty or distress noted; Pt reports surgery 10/5 for kidney stone on left; stent place; c/o hematuria today with left side/abd pain

## 2016-02-23 NOTE — ED Notes (Signed)
MD in room to assess patient.  Will continue to monitor.   

## 2016-02-23 NOTE — ED Notes (Signed)
Family member reports rash noted to both arms

## 2016-02-25 ENCOUNTER — Ambulatory Visit
Admission: RE | Admit: 2016-02-25 | Discharge: 2016-02-25 | Disposition: A | Discharge status: Home / Self Care | Payer: 59 | Source: Ambulatory Visit | Attending: Urology | Admitting: Urology

## 2016-02-25 ENCOUNTER — Encounter: Payer: Self-pay | Admitting: Urology

## 2016-02-25 ENCOUNTER — Ambulatory Visit (INDEPENDENT_AMBULATORY_CARE_PROVIDER_SITE_OTHER): Payer: 59 | Admitting: Urology

## 2016-02-25 VITALS — BP 118/77 | HR 68 | Ht 71.0 in | Wt 212.0 lb

## 2016-02-25 DIAGNOSIS — N200 Calculus of kidney: Secondary | ICD-10-CM

## 2016-02-25 LAB — URINALYSIS, COMPLETE
Bilirubin, UA: NEGATIVE
GLUCOSE, UA: NEGATIVE
Ketones, UA: NEGATIVE
Nitrite, UA: NEGATIVE
PH UA: 6 (ref 5.0–7.5)
Specific Gravity, UA: 1.025 (ref 1.005–1.030)
UUROB: 0.2 mg/dL (ref 0.2–1.0)

## 2016-02-25 LAB — MICROSCOPIC EXAMINATION
BACTERIA UA: NONE SEEN
Epithelial Cells (non renal): NONE SEEN /hpf (ref 0–10)
RBC, UA: >30 /hpf — AB (ref 0–?)
WBC UA: NONE SEEN /HPF (ref 0–?)

## 2016-02-27 NOTE — Progress Notes (Signed)
02/25/2016 2:48 PM   Jordan Mcpherson 21-Dec-1990 [id number]  Referring provider: No referring provider defined for this encounter.  Chief Complaint  Patient presents with  . Nephrolithiasis    post litho    HPI: 25 year old male who returns today for day's following left ESWL. The patient was taken to the operating room one week ago for cystoscopy, stent placement prior to ESWL. Unfortunately, he was administered Toradol intraoperatively and was unable to have shockwave lithotripsy on the day of stent placement as previously planned. He returned to the lithotripsy truck on Monday for shockwave lithotripsy in [place] per Dr. Sherryl Barters.  He tolerated the procedure well. Unfortunately, he returned to the emergency room 2 nights ago due to stent discomfort and hematuria. His hemoglobin was stable and he was emptying his bladder well. He admits that he went back to work and was lifting heavy boxes and not drinking enough water.  Today he has no complaints. He is voiding well. He is tolerating the stent. No fevers or chills.  KUB today does show some very faint calcifications of stone dust fragment in the left lower pole and a question of some stone fragments along the stent within the mid and proximal ureter.   PMH: Past Medical History:  Diagnosis Date  . Anxiety   . Depression   . GERD (gastroesophageal reflux disease)    very rare-no meds  . Hx MRSA infection   . Kidney stones   . Renal disorder     Surgical History: Past Surgical History:  Procedure Laterality Date  . CYSTOSCOPY WITH STENT PLACEMENT Left 02/17/2016   Procedure: CYSTOSCOPY WITH STENT PLACEMENT;  Surgeon: Vanna Scotland, MD;  Location: [facility];  Service: Urology;  Laterality: Left;  . CYSTOSCOPY/URETEROSCOPY/HOLMIUM LASER/STENT PLACEMENT Right 11/15/2015   Procedure: CYSTOSCOPY/URETEROSCOPY/STENT PLACEMENT;  Surgeon: Vanna Scotland, MD;  Location: [facility];  Service: Urology;  Laterality: Right;  .  CYSTOSCOPY/URETEROSCOPY/HOLMIUM LASER/STENT PLACEMENT Right 11/22/2015   Procedure: CYSTOSCOPY/URETEROSCOPY/HOLMIUM LASER/STENT EXCHANGE;  Surgeon: Vanna Scotland, MD;  Location: [facility];  Service: Urology;  Laterality: Right;  . EXTRACORPOREAL SHOCK WAVE LITHOTRIPSY Left 02/17/2016   Was not done because patient had toradol in recovery  . none    . PILONIDAL CYST DRAINAGE      Home Medications:    Medication List       Accurate as of 02/25/16 11:59 PM. Always use your most recent med list.          docusate sodium 100 MG capsule Commonly known as:  COLACE Take 1 capsule (100 mg total) by mouth 2 (two) times daily.   oxybutynin 5 MG tablet Commonly known as:  DITROPAN Take 1 tablet (5 mg total) by mouth every 8 (eight) hours as needed for bladder spasms.   oxyCODONE-acetaminophen 5-325 MG tablet Commonly known as:  PERCOCET Take 1-2 tablets by mouth every 4 (four) hours as needed for moderate pain or severe pain.   tamsulosin 0.4 MG Caps capsule Commonly known as:  FLOMAX Take 1 capsule (0.4 mg total) by mouth daily.       Allergies: No Known Allergies  Family History: Family History  Problem Relation Age of Onset  . Kidney Stones Father   . Prostate cancer Maternal Uncle   . Kidney disease Neg Hx     Social History:  reports that he has been smoking Cigarettes.  He has a 3.50 pack-year smoking history. He has never used smokeless tobacco. He reports that he does not drink alcohol or use drugs.  ROS: UROLOGY Frequent Urination?: No Hard to postpone urination?: No Burning/pain with urination?: No Get up at night to urinate?: No Leakage of urine?: No Urine stream starts and stops?: No Trouble starting stream?: No Do you have to strain to urinate?: No Blood in urine?: No Urinary tract infection?: No Sexually transmitted disease?: No Injury to kidneys or bladder?: No Painful intercourse?: No Weak stream?: No Erection problems?: No Penile pain?:  No  Gastrointestinal Nausea?: No Vomiting?: No Indigestion/heartburn?: No Diarrhea?: No Constipation?: No  Constitutional Fever: No Night sweats?: No Weight loss?: No Fatigue?: No  Skin Skin rash/lesions?: No Itching?: No  Eyes Blurred vision?: No Double vision?: No  Ears/Nose/Throat Sore throat?: No Sinus problems?: No  Hematologic/Lymphatic Swollen glands?: No Easy bruising?: No  Cardiovascular Leg swelling?: No Chest pain?: No  Respiratory Cough?: No Shortness of breath?: No  Endocrine Excessive thirst?: No  Musculoskeletal Back pain?: No Joint pain?: No  Neurological Headaches?: No Dizziness?: No  Psychologic Depression?: No Anxiety?: No  Physical Exam: BP 118/77   Pulse 68   Ht 5\' 11"  (1.803 m)   Wt 212 lb (96.2 kg)   BMI 29.57 kg/m   Constitutional:  Alert and oriented, No acute distress.  Accompanied by his fiance today. HEENT: [place] AT, moist mucus membranes.  Trachea midline, no masses. Cardiovascular: No clubbing, cyanosis, or edema. Respiratory: Normal respiratory effort, no increased work of breathing. GI: Abdomen is soft, nontender, nondistended, no abdominal masses GU: No CVA tenderness.  Skin: No rashes, bruises or suspicious lesions. Neurologic: Grossly intact, no focal deficits, moving all 4 extremities. Psychiatric: Normal mood and affect.  Laboratory Data: Lab Results  Component Value Date   WBC 11.6 (H) 02/23/2016   HGB 15.5 02/23/2016   HCT 44.4 02/23/2016   MCV 84.2 02/23/2016   PLT 234 02/23/2016    Lab Results  Component Value Date   CREATININE 1.17 02/23/2016    Urinalysis Results for orders placed or performed in visit on 02/25/16  Microscopic Examination  Result Value Ref Range   WBC, UA None seen 0 - 5 /hpf   RBC, UA >30 (A) 0 - 2 /hpf   Epithelial Cells (non renal) None seen 0 - 10 /hpf   Bacteria, UA None seen None seen/Few  Urinalysis, Complete  Result Value Ref Range   Specific Gravity, UA  1.025 1.005 - 1.030   pH, UA 6.0 5.0 - 7.5   Color, UA Amber (A) Yellow   Appearance Ur Cloudy (A) Clear   Leukocytes, UA 1+ (A) Negative   Protein, UA 2+ (A) Negative/Trace   Glucose, UA Negative Negative   Ketones, UA Negative Negative   RBC, UA 3+ (A) Negative   Bilirubin, UA Negative Negative   Urobilinogen, Ur 0.2 0.2 - 1.0 mg/dL   Nitrite, UA Negative Negative   Microscopic Examination See below:     Pertinent Imaging: CLINICAL DATA:  Recent nephrolithiasis with double-J stent placement  EXAM: ABDOMEN - 1 VIEW  COMPARISON:  February 17, 2016 the previously noted calculus in the mid left kidney is no longer evident. There is a double-J stent extending from the left renal pelvis bladder. No new calcifications are evident. There is moderate stool throughout colon. No bowel obstruction or free air.  FINDINGS: Double-J stent now present on the left extending from the left renal pelvis bladder. Previously noted left renal calculus no longer evident. No abnormal calcifications evident currently. Bowel gas pattern unremarkable.  IMPRESSION: Negative.   Electronically Signed   By: Chrissie NoaWilliam  Margarita Grizzle III M.D.   On: 02/25/2016 11:38  ABG was preserved you today. There is some dustlike material in the left lower pole as well as some possible calcifications along the mid and proximal stent which is not appreciated by the radiologist. No residual large stone burden.  Assessment & Plan:    1. Nephrolithiasis Recommend continuation of stent for an additional week, KUB prior Hesitant to remove the stent given his history of steinstrasse and need for multiple procedures in the past Tolerating the stent well, encourage continued adequate hydration Symptoms are reviewed - Urinalysis, Complete - Abdomen 1 view (KUB) - DG Abd 1 View; Future  2. Recurrent kidney stones Recommend consideration of 24-hour urine metabolic workup given history of recurrent multiple and bilateral  stones.  Return in about 1 week (around 03/03/2016) for cysto/ possible stent removal with KUB prior.  Vanna Scotland, MD  [facility] [address] [place], [place] [address] [phone]

## 2016-03-02 ENCOUNTER — Ambulatory Visit
Admission: RE | Admit: 2016-03-02 | Discharge: 2016-03-02 | Disposition: A | Discharge status: Home / Self Care | Payer: 59 | Source: Ambulatory Visit | Attending: Urology | Admitting: Urology

## 2016-03-02 ENCOUNTER — Encounter: Payer: Self-pay | Admitting: Urology

## 2016-03-02 ENCOUNTER — Ambulatory Visit (INDEPENDENT_AMBULATORY_CARE_PROVIDER_SITE_OTHER): Payer: 59 | Admitting: Urology

## 2016-03-02 VITALS — BP 121/72 | HR 73 | Ht 71.0 in | Wt 214.0 lb

## 2016-03-02 DIAGNOSIS — Z9689 Presence of other specified functional implants: Secondary | ICD-10-CM | POA: Insufficient documentation

## 2016-03-02 DIAGNOSIS — N200 Calculus of kidney: Secondary | ICD-10-CM

## 2016-03-02 LAB — MICROSCOPIC EXAMINATION
Epithelial Cells (non renal): NONE SEEN /hpf (ref 0–10)
RBC, UA: >30 /hpf — AB (ref 0–?)
WBC, UA: NONE SEEN /hpf (ref 0–?)

## 2016-03-02 LAB — URINALYSIS, COMPLETE
BILIRUBIN UA: NEGATIVE
GLUCOSE, UA: NEGATIVE
Nitrite, UA: NEGATIVE
Urobilinogen, Ur: 0.2 mg/dL (ref 0.2–1.0)
pH, UA: 6 (ref 5.0–7.5)

## 2016-03-02 MED ORDER — LIDOCAINE HCL 2 % EX GEL
1.0000 "application " | Freq: Once | CUTANEOUS | Status: AC
  Administered 2016-03-02: 1 via URETHRAL

## 2016-03-02 MED ORDER — CIPROFLOXACIN HCL 500 MG PO TABS
500.0000 mg | ORAL_TABLET | Freq: Once | ORAL | Status: AC
  Administered 2016-03-02: 500 mg via ORAL

## 2016-03-02 NOTE — Progress Notes (Signed)
03/02/2016 2:36 PM   Jordan Mcpherson [birth year] [id number]  Referring provider: No referring provider defined for this encounter.  Chief Complaint  Patient presents with  . Cysto Stent Removal    HPI: 25 year old male who returns today for day's following left ESWL. The patient was taken to the operating room one week ago for cystoscopy, stent placement prior to ESWL. Unfortunately, he was administered Toradol intraoperatively and was unable to have shockwave lithotripsy on the day of stent placement as previously planned. He returned to the lithotripsy truck on Monday for shockwave lithotripsy in [place] per Dr. Sherryl Barters.  Today he has no complaints. He is voiding well. He is tolerating the stent. No fevers or chills.  He is working full time.    KUB last week showed some very faint calcifications of stone dust fragment in the left lower pole and a question of some stone fragments along the stent within the mid and proximal ureter.   He was advised to maintain the stent for an addition week and return   Repeat KUB today shows improved stone fragments along the stent measuring up to 2.5 mm.  Overall peri-stent burden since last week but still present.    Options today discussed in detail including allowing stent to remain 2 addition weeks to allow for complete clearance of ureteral stone fragments with reevaluation vs. Stent removal today vs. proceding to operating room for ureteroscopy to clear residual stone burden.  He is anxious to have his stent removed.  Although the overall ureteral stone burden is quite low, there is a chance that he would become obstructed from the stone and possibly require emergent or urgent further intervention. He understands these risks and is willing to have his stent removed today despite this.   PMH: Past Medical History:  Diagnosis Date  . Anxiety   . Depression   . GERD (gastroesophageal reflux disease)    very rare-no meds  . Hx MRSA  infection   . Kidney stones   . Renal disorder     Surgical History: Past Surgical History:  Procedure Laterality Date  . CYSTOSCOPY WITH STENT PLACEMENT Left 02/17/2016   Procedure: CYSTOSCOPY WITH STENT PLACEMENT;  Surgeon: Vanna Scotland, MD;  Location: [facility];  Service: Urology;  Laterality: Left;  . CYSTOSCOPY/URETEROSCOPY/HOLMIUM LASER/STENT PLACEMENT Right 11/15/2015   Procedure: CYSTOSCOPY/URETEROSCOPY/STENT PLACEMENT;  Surgeon: Vanna Scotland, MD;  Location: [facility];  Service: Urology;  Laterality: Right;  . CYSTOSCOPY/URETEROSCOPY/HOLMIUM LASER/STENT PLACEMENT Right 11/22/2015   Procedure: CYSTOSCOPY/URETEROSCOPY/HOLMIUM LASER/STENT EXCHANGE;  Surgeon: Vanna Scotland, MD;  Location: [facility];  Service: Urology;  Laterality: Right;  . EXTRACORPOREAL SHOCK WAVE LITHOTRIPSY Left 02/17/2016   Was not done because patient had toradol in recovery  . none    . PILONIDAL CYST DRAINAGE      Home Medications:    Medication List       Accurate as of 03/02/16  2:36 PM. Always use your most recent med list.          docusate sodium 100 MG capsule Commonly known as:  COLACE Take 1 capsule (100 mg total) by mouth 2 (two) times daily.   oxybutynin 5 MG tablet Commonly known as:  DITROPAN Take 1 tablet (5 mg total) by mouth every 8 (eight) hours as needed for bladder spasms.   oxyCODONE-acetaminophen 5-325 MG tablet Commonly known as:  PERCOCET Take 1-2 tablets by mouth every 4 (four) hours as needed for moderate pain or severe pain.   tamsulosin 0.4 MG Caps  capsule Commonly known as:  FLOMAX Take 1 capsule (0.4 mg total) by mouth daily.       Allergies: No Known Allergies  Family History: Family History  Problem Relation Age of Onset  . Kidney Stones Father   . Prostate cancer Maternal Uncle   . Kidney disease Neg Hx     Social History:  reports that he has been smoking Cigarettes.  He has a 3.50 pack-year smoking history. He has never used smokeless tobacco. He  reports that he does not drink alcohol or use drugs.   Physical Exam: BP 121/72   Pulse 73   Ht 5\' 11"  (1.803 m)   Wt 214 lb (97.1 kg)   BMI 29.85 kg/m   Constitutional:  Alert and oriented, No acute distress.  Accompanied by his fiance today. HEENT: [place] AT, moist mucus membranes.  Trachea midline, no masses. Cardiovascular: No clubbing, cyanosis, or edema. Respiratory: Normal respiratory effort, no increased work of breathing. GI: Abdomen is soft, nontender, nondistended, no abdominal masses GU:  Circumcised phallus. Orthotopic meatus. Skin: No rashes, bruises or suspicious lesions. Neurologic: Grossly intact, no focal deficits, moving all 4 extremities. Psychiatric: Normal mood and affect.  Laboratory Data: Lab Results  Component Value Date   WBC 11.6 (H) 02/23/2016   HGB 15.5 02/23/2016   HCT 44.4 02/23/2016   MCV 84.2 02/23/2016   PLT 234 02/23/2016    Lab Results  Component Value Date   CREATININE 1.17 02/23/2016    Urinalysis Review today, consistent with ureteral stent  Pertinent Imaging: CLINICAL DATA:  Follow-up lithotripsy.  EXAM: ABDOMEN - 1 VIEW  COMPARISON:  02/25/2016.  FINDINGS: Double-J left ureteral stent in stable position. Previously identified large left renal stone no longer identified. Tiny stone fragments projected over the left upper ureter and left ureter lower ureter cannot be excluded. No bowel distention. No acute bony abnormality P  IMPRESSION: Double-J left ureteral stent in stable position. Previously identified large left renal stone no longer identified. Tiny stone fragments projected over the left upper ureter and left lower ureter cannot be excluded.   Electronically Signed   By: Maisie Fushomas  Register   On: 03/02/2016 09:16  KUB reviewed personally today with the patient. This was compared to his previous KUB last week and preop.  Cystoscopy/ Stent removal procedure  Patient identification was confirmed, informed  consent was obtained, and patient was prepped using Betadine solution.  Lidocaine jelly was administered per urethral meatus.    Preoperative abx where received prior to procedure.    Procedure: - Flexible cystoscope introduced, without any difficulty.   - Thorough search of the bladder revealed:    normal urethral meatus  Stent seen emanating from left ureteral orifice, grasped with stent graspers, and removed in entirety.    Post-Procedure: - Patient tolerated the procedure well   Assessment & Plan:    1. Nephrolithiasis KUB reviewed today prior to stent removal. His options were discussed in detail as above. He is elected to remove the stent despite possibility of some small residual stone fragments in the ureter. He understands the risks  Today without difficulty.  Warning symptoms are reviewed in detail.  He'll follow-up in 4 weeks with a renal ultrasound prior.  - Urinalysis, Complete - RUS  2. Recurrent kidney stones Recommend consideration of 24-hour urine metabolic workup given history of recurrent multiple and bilateral stones. Will discuss at f/u visit  Return in about 1 month (around 04/02/2016) for f/u RUS.  Vanna ScotlandAshley Cashlyn Huguley, MD  [facility] [address] [place], [place] [address] [phone]

## 2016-04-12 ENCOUNTER — Telehealth: Payer: Self-pay | Admitting: Urology

## 2016-04-12 ENCOUNTER — Ambulatory Visit: Payer: 59 | Admitting: Urology

## 2016-04-12 ENCOUNTER — Encounter: Payer: Self-pay | Admitting: Urology

## 2016-04-12 NOTE — Telephone Encounter (Signed)
Can you please touch base with this patient and find out why he didn't have his ultrasound and follow up?  Its really important to know that there is not residual kidney swelling.    Please ask him to reschedule with ultrasound at minimum.  If need be, I can call him with results.  Vanna ScotlandAshley Wetzel Meester, MD

## 2016-04-12 NOTE — Telephone Encounter (Signed)
No answer

## 2016-04-13 NOTE — Telephone Encounter (Signed)
No vm set up. 

## 2016-04-17 NOTE — Telephone Encounter (Signed)
No vm set up. 

## 2016-04-18 NOTE — Telephone Encounter (Signed)
Will send a certified letter.  

## 2016-09-04 IMAGING — US US RENAL
1 series · 14 of 25 positions shown · non-contrast
Comparison: 11/09/2015.

CLINICAL DATA: History of kidney stones.  Flank pain.

EXAM:
RENAL / URINARY TRACT ULTRASOUND COMPLETE

[Series 1: us renal · 0.28mm/px · 14 of 32 slices shown]
[im 1/32]
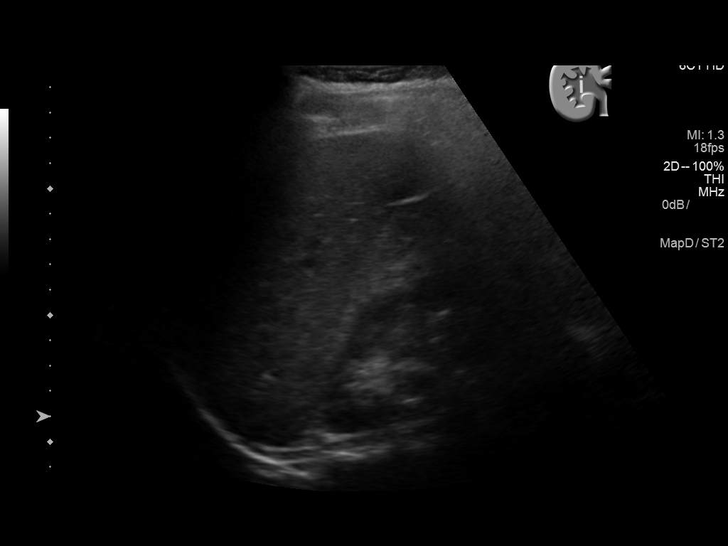
[im 3/32]
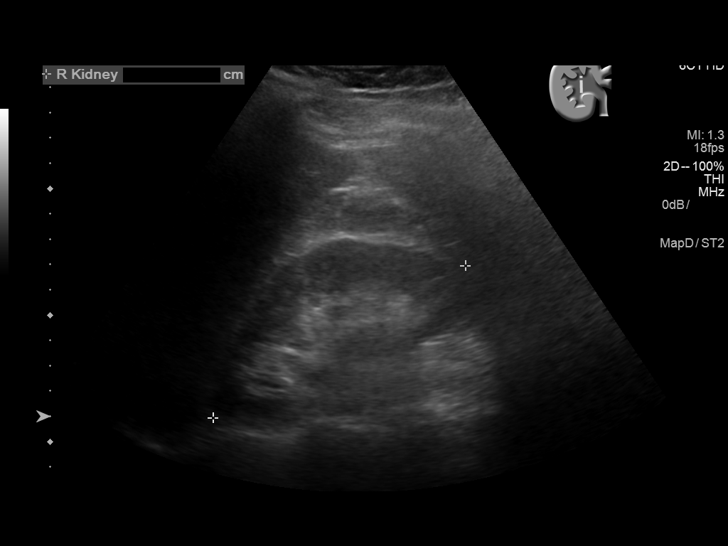
[im 6/32]
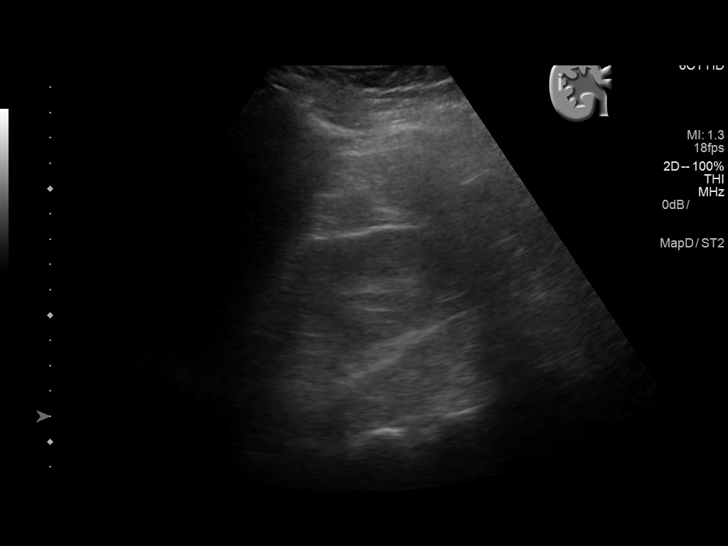
[im 8/32]
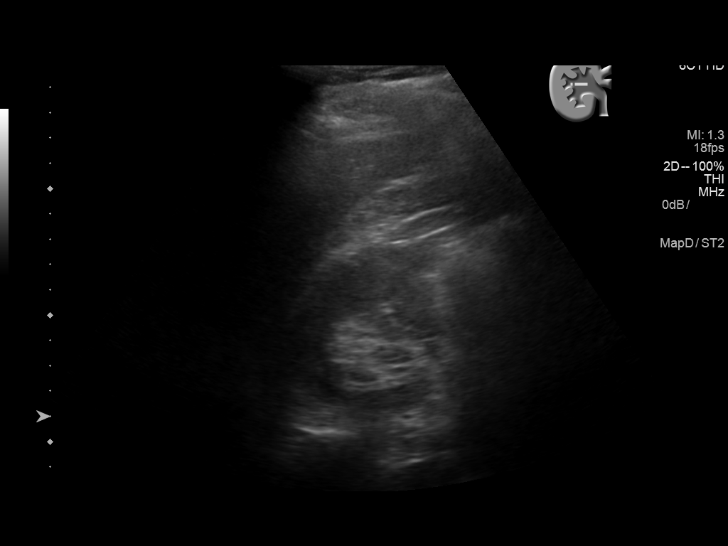
[im 11/32]
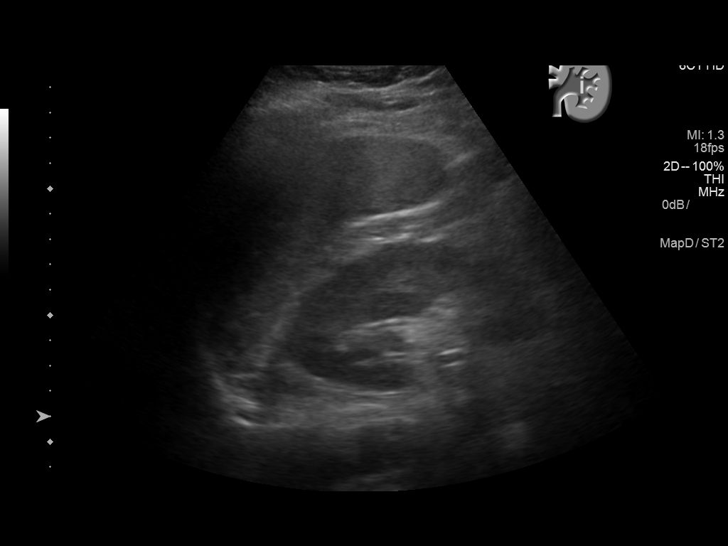
[im 12/32]
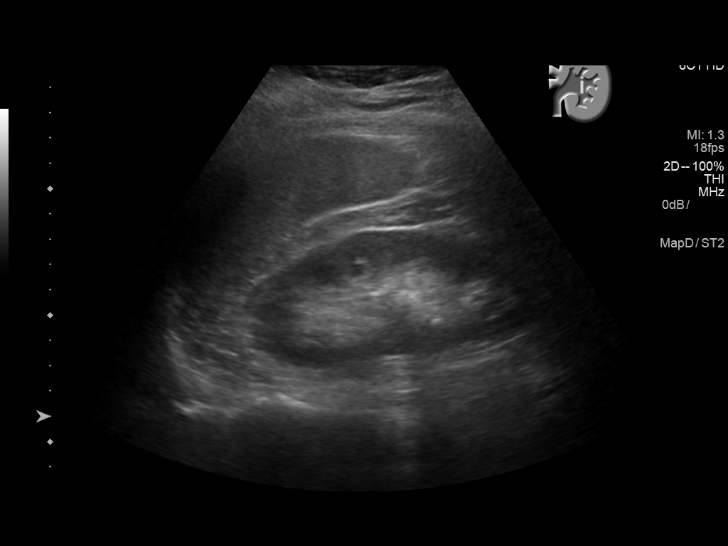
[im 15/32]
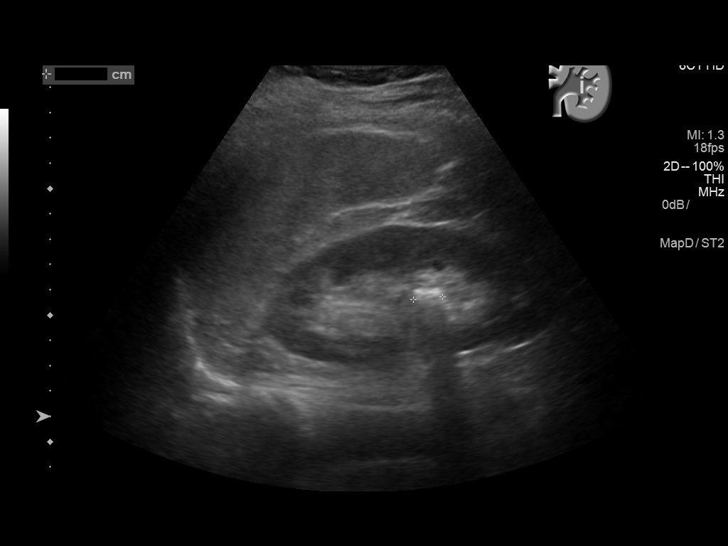
[im 17/32]
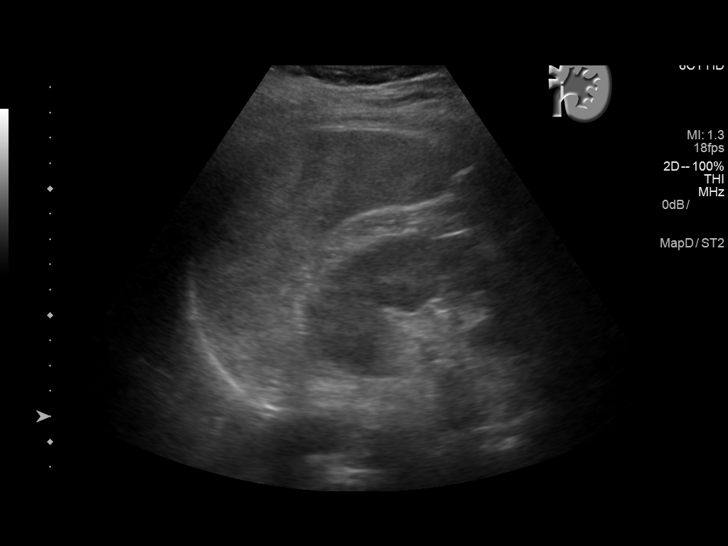
[im 20/32]
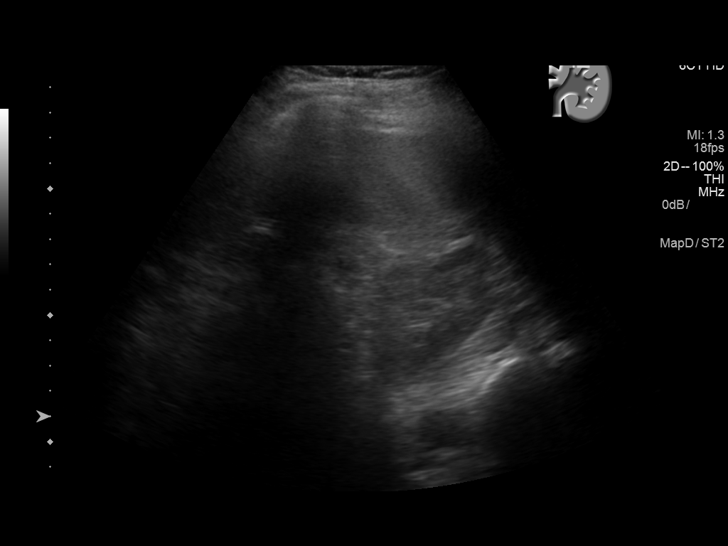
[im 21/32]
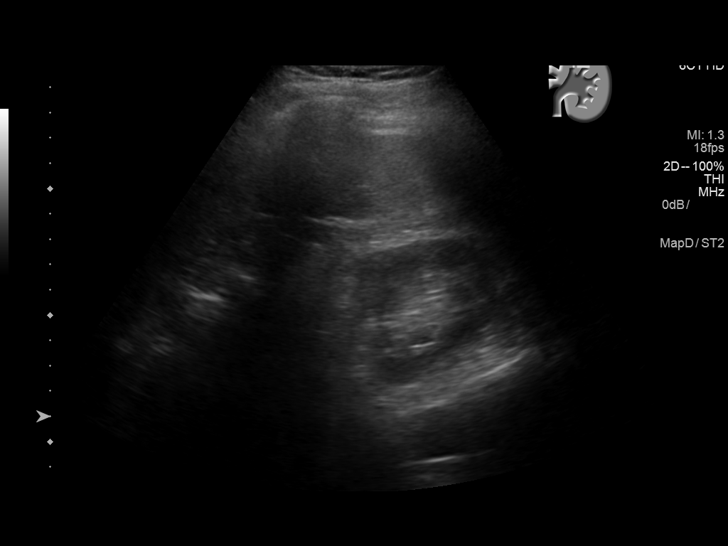
[im 24/32]
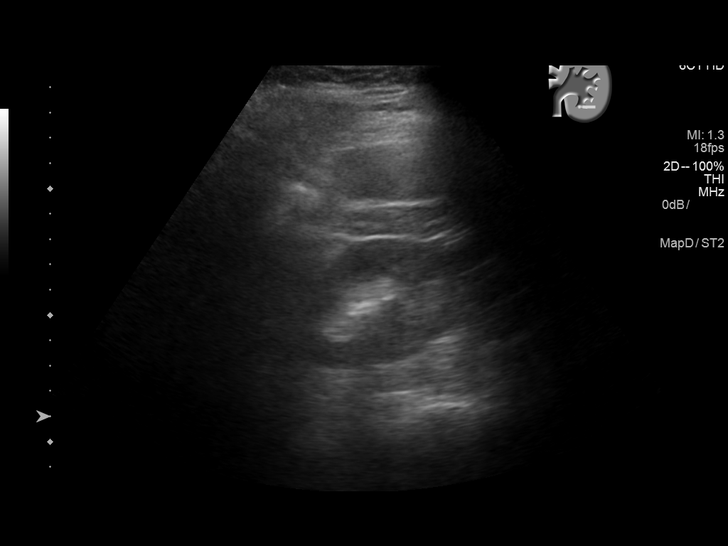
[im 26/32]
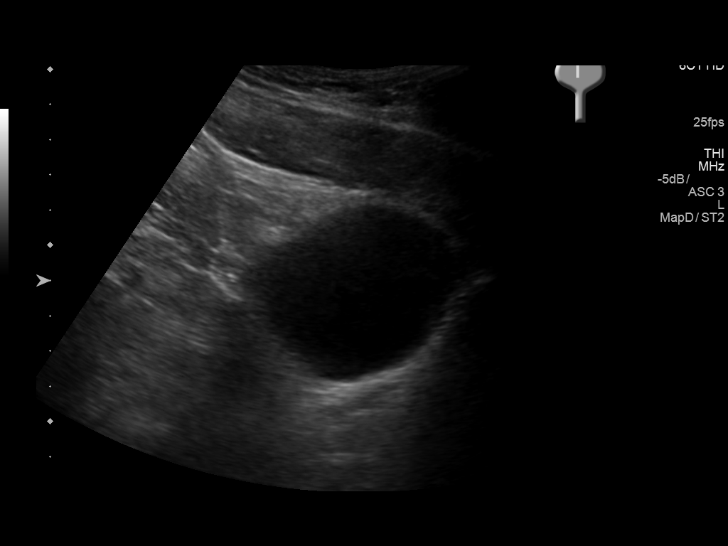
[im 29/32]
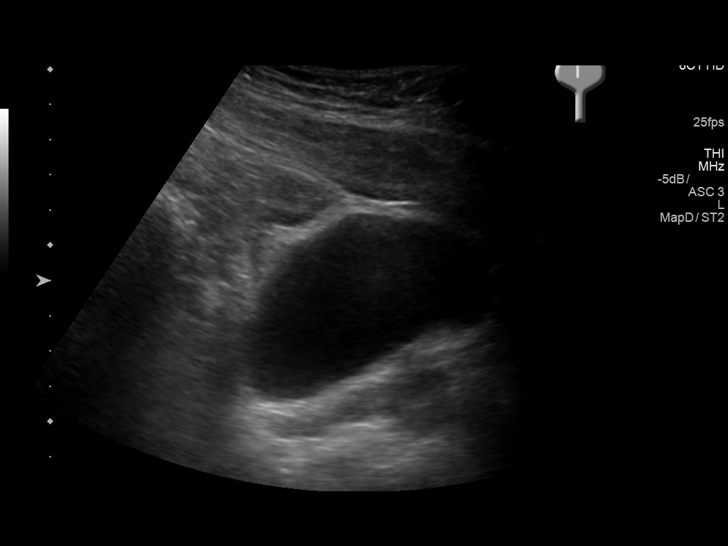
[im 32/32]
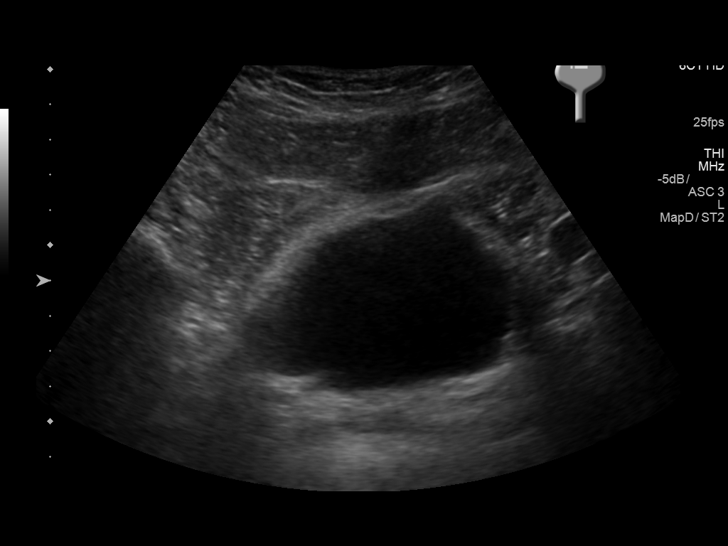

[14 of 25 positions shown; findings below may reference images not displayed]

FINDINGS: Right Kidney:

Length: 11.6 cm. Echogenicity within normal limits. No mass or
hydronephrosis visualized.

Left Kidney:

Length: 11.7 cm. Echogenicity within normal limits. No mass or
hydronephrosis visualized. 1.2 cm nonobstructing left renal stone.

Bladder:

Appears normal for degree of bladder distention.
IMPRESSION: 1.  1.2 cm nonobstructing left renal stone.

2. Exam otherwise unremarkable.

## 2017-02-11 DIAGNOSIS — F17210 Nicotine dependence, cigarettes, uncomplicated: Secondary | ICD-10-CM | POA: Insufficient documentation

## 2017-02-11 DIAGNOSIS — Z79899 Other long term (current) drug therapy: Secondary | ICD-10-CM | POA: Insufficient documentation

## 2017-02-11 DIAGNOSIS — L509 Urticaria, unspecified: Secondary | ICD-10-CM | POA: Insufficient documentation

## 2017-02-11 DIAGNOSIS — L519 Erythema multiforme, unspecified: Secondary | ICD-10-CM | POA: Insufficient documentation

## 2017-02-11 MED ORDER — METHYLPREDNISOLONE SODIUM SUCC 125 MG IJ SOLR
125.0000 mg | Freq: Once | INTRAMUSCULAR | Status: AC
  Administered 2017-02-11: 125 mg via INTRAVENOUS

## 2017-02-11 MED ORDER — FAMOTIDINE IN NACL 20-0.9 MG/50ML-% IV SOLN
20.0000 mg | Freq: Once | INTRAVENOUS | Status: AC
  Administered 2017-02-11: 20 mg via INTRAVENOUS

## 2017-02-11 MED ORDER — METHYLPREDNISOLONE SODIUM SUCC 125 MG IJ SOLR
INTRAMUSCULAR | Status: AC
  Filled 2017-02-11: qty 2

## 2017-02-11 MED ORDER — FAMOTIDINE IN NACL 20-0.9 MG/50ML-% IV SOLN
INTRAVENOUS | Status: AC
  Filled 2017-02-11: qty 50

## 2017-02-11 NOTE — ED Notes (Signed)
Pt presents with scattered hives to entire back that have worsened throughout the day; Benadryl given earlier has provided no relief; no breathing difficulty

## 2017-02-11 NOTE — ED Triage Notes (Signed)
Pt states has had hives for over one month. Pt states tonight has the sensation "that my throat is closing" pt has patent airway, hives noted to arms, hands, beginning on side or right lateral eyelid. Pt also complains of hives to "my back, all over".

## 2017-02-11 NOTE — ED Notes (Signed)
Pt took benadryl pta per pt

## 2017-02-12 ENCOUNTER — Emergency Department
Admission: EM | Admit: 2017-02-12 | Discharge: 2017-02-12 | Disposition: A | Discharge status: Home / Self Care | Payer: 59 | Attending: Emergency Medicine | Admitting: Emergency Medicine

## 2017-02-12 DIAGNOSIS — L519 Erythema multiforme, unspecified: Secondary | ICD-10-CM

## 2017-02-12 DIAGNOSIS — L509 Urticaria, unspecified: Secondary | ICD-10-CM

## 2017-02-12 MED ORDER — PREDNISONE 20 MG PO TABS: 60.0000 mg | ORAL_TABLET | Freq: Every day | ORAL | 0 refills | Status: DC

## 2017-02-12 MED ORDER — CETIRIZINE HCL 10 MG PO TABS: 10.0000 mg | ORAL_TABLET | Freq: Every day | ORAL | 0 refills | Status: AC

## 2017-02-12 NOTE — Discharge Instructions (Signed)
Please follow up with the acute care clinic and an allergist

## 2017-02-12 NOTE — ED Notes (Signed)
ED Provider at bedside. 

## 2017-02-12 NOTE — ED Notes (Signed)
No hives noted to face, pt continues to have rash noted to arms, states itching "a little better"

## 2017-02-12 NOTE — ED Provider Notes (Signed)
[facility] Emergency Department Provider Note   ____________________________________________   First MD Initiated Contact with Patient 02/12/17 0129     (approximate)  I have reviewed the triage vital signs and the nursing notes.   HISTORY  Chief Complaint Allergic Reaction    HPI JIYAN WALKOWSKI is a 26 y.o. male Who comes into the hospital today with hives. He reports that he's had hives on and off for about 2 months. He states though that it has been bothering him a lot for the past week. Today he felt as though his throat was closing so his significant other convinced him to come in and get checked out. He reports he eats the same things every day and is nothing that has been unremarkable. He also denies any change in his laundry detergents or lotions or soaps. He has been taking Benadryl to help but it has not been helping recently. The patient was concerned because this is been going on so long so he decided to come in and get checked out.   Past Medical History:  Diagnosis Date  . Anxiety   . Depression   . GERD (gastroesophageal reflux disease)    very rare-no meds  . Hx MRSA infection   . Kidney stones   . Renal disorder     There are no active problems to display for this patient.   Past Surgical History:  Procedure Laterality Date  . CYSTOSCOPY WITH STENT PLACEMENT Left 02/17/2016   Procedure: CYSTOSCOPY WITH STENT PLACEMENT;  Surgeon: Hollice Espy, MD;  Location: [facility] OR[facility];  Service: Urology;  Laterality: Left;  . CYSTOSCOPY/URETEROSCOPY/HOLMIUM LASER/STENT PLACEMENT Right 11/15/2015   Procedure: CYSTOSCOPY/URETEROSCOPY/STENT PLACEMENT;  Surgeon: Hollice Espy, MD;  Location: [facility];  Service: Urology;  Laterality: Right;  . CYSTOSCOPY/URETEROSCOPY/HOLMIUM LASER/STENT PLACEMENT Right 11/22/2015   Procedure: CYSTOSCOPY/URETEROSCOPY/HOLMIUM LASER/STENT EXCHANGE;  Surgeon: Hollice Espy, MD;  Location: [facility];  Service:  Urology;  Laterality: Right;  . EXTRACORPOREAL SHOCK WAVE LITHOTRIPSY Left 02/17/2016   Was not done because patient had toradol in recovery  . none    . PILONIDAL CYST DRAINAGE      Prior to Admission medications   Medication Sig Start Date End Date Taking? Authorizing Provider  cetirizine (ZYRTEC HIVES RELIEF) 10 MG tablet Take 1 tablet (10 mg total) by mouth daily. 02/12/17   Loney Hering, MD  docusate sodium (COLACE) 100 MG capsule Take 1 capsule (100 mg total) by mouth 2 (two) times daily. 02/17/16   Hollice Espy, MD  oxybutynin (DITROPAN) 5 MG tablet Take 1 tablet (5 mg total) by mouth every 8 (eight) hours as needed for bladder spasms. 02/17/16   Hollice Espy, MD  oxyCODONE-acetaminophen (PERCOCET) 5-325 MG tablet Take 1-2 tablets by mouth every 4 (four) hours as needed for moderate pain or severe pain. 02/17/16   Hollice Espy, MD  predniSONE (DELTASONE) 20 MG tablet Take 3 tablets (60 mg total) by mouth daily. 02/12/17   Loney Hering, MD  tamsulosin (FLOMAX) 0.4 MG CAPS capsule Take 1 capsule (0.4 mg total) by mouth daily. 02/17/16   Hollice Espy, MD    Allergies Patient has no known allergies.  Family History  Problem Relation Age of Onset  . Kidney Stones Father   . Prostate cancer Maternal Uncle   . Kidney disease Neg Hx     Social History Social History  Substance Use Topics  . Smoking status: Current Every Day Smoker    Packs/day: 0.50    Years:  7.00    Types: Cigarettes  . Smokeless tobacco: Never Used  . Alcohol use No    Review of Systems  Constitutional: No fever/chills Eyes: No visual changes. ENT: throat constriction Cardiovascular: Denies chest pain. Respiratory: Denies shortness of breath. Gastrointestinal: No abdominal pain.  No nausea, no vomiting.  No diarrhea.  No constipation. Genitourinary: Negative for dysuria. Musculoskeletal: Negative for back pain. Skin: hives Neurological: Negative for headaches, focal weakness or  numbness.   ____________________________________________   PHYSICAL EXAM:  VITAL SIGNS: ED Triage Vitals [02/11/17 2157]  Enc Vitals Group     BP 130/79     Pulse Rate 83     Resp 16     Temp 97.9 F (36.6 C)     Temp Source Oral     SpO2 100 %     Weight 220 lb (99.8 kg)     Height 5\' 11"  (1.803 m)     Head Circumference      Peak Flow      Pain Score      Pain Loc      Pain Edu?      Excl. in [place]?     Constitutional: Alert and oriented. Well appearing and in mild distress. Eyes: Conjunctivae are normal. PERRL. EOMI. Head: Atraumatic. Nose: No congestion/rhinnorhea. Mouth/Throat: Mucous membranes are moist.  Oropharynx non-erythematous. no lesions in mouth Neck: No stridor.   Cardiovascular: Normal rate, regular rhythm. Grossly normal heart sounds.  Good peripheral circulation. Respiratory: Normal respiratory effort.  No retractions. Lungs CTAB. Gastrointestinal: Soft and nontender. No distention. positive bowel sounds Musculoskeletal: No lower extremity tenderness nor edema.   Neurologic:  Normal speech and language.  Skin:  Skin is warm, dry and intact. circular hives with a clear noted to the patient's arms as well as back. Psychiatric: Mood and affect are normal.   ____________________________________________   LABS (all labs ordered are listed, but only abnormal results are displayed)  Labs Reviewed - No data to display ____________________________________________  EKG  none ____________________________________________  RADIOLOGY  No results found.  ____________________________________________   PROCEDURES  Procedure(s) performed: None  Procedures  Critical Care performed: No  ____________________________________________   INITIAL IMPRESSION / ASSESSMENT AND PLAN / ED COURSE  Pertinent labs & imaging results that were available during my care of the patient were reviewed by me and considered in my medical decision making (see chart for  details).  This is a 26 year old male who comes into the hospital today with some hives on his arm and his back.  My differential diagnosis includes hives, allergic reaction, erythema multiforme.  The patient did receive some Solu-Medrol as well as Pepcid. His hives seem to be clearing but not fully gone. Given the appearance I am still concerned he may have some erythema multiforme minor. I will discharge the patient with some prednisone as well as Zyrtec. He should follow up at the acute care clinic for further evaluation of his chronic hives and possible erythema multiforme. Patient has no other concerns and is not in any distress.      ____________________________________________   FINAL CLINICAL IMPRESSION(S) / ED DIAGNOSES  Final diagnoses:  Hives  Erythema multiforme      NEW MEDICATIONS STARTED DURING THIS VISIT:  New Prescriptions   CETIRIZINE (ZYRTEC HIVES RELIEF) 10 MG TABLET    Take 1 tablet (10 mg total) by mouth daily.   PREDNISONE (DELTASONE) 20 MG TABLET    Take 3 tablets (60 mg total) by mouth daily.  Note:  This document was prepared using Dragon voice recognition software and may include unintentional dictation errors.    Loney Hering, MD 02/12/17 [phone]

## 2017-07-23 ENCOUNTER — Emergency Department
Admission: EM | Admit: 2017-07-23 | Discharge: 2017-07-23 | Disposition: A | Discharge status: Home / Self Care | Payer: No Typology Code available for payment source | Attending: Emergency Medicine | Admitting: Emergency Medicine

## 2017-07-23 ENCOUNTER — Encounter: Payer: Self-pay | Admitting: Emergency Medicine

## 2017-07-23 ENCOUNTER — Other Ambulatory Visit: Payer: Self-pay

## 2017-07-23 DIAGNOSIS — R21 Rash and other nonspecific skin eruption: Secondary | ICD-10-CM | POA: Diagnosis present

## 2017-07-23 DIAGNOSIS — Z79899 Other long term (current) drug therapy: Secondary | ICD-10-CM | POA: Diagnosis not present

## 2017-07-23 DIAGNOSIS — L509 Urticaria, unspecified: Secondary | ICD-10-CM | POA: Insufficient documentation

## 2017-07-23 DIAGNOSIS — F17210 Nicotine dependence, cigarettes, uncomplicated: Secondary | ICD-10-CM | POA: Insufficient documentation

## 2017-07-23 DIAGNOSIS — F329 Major depressive disorder, single episode, unspecified: Secondary | ICD-10-CM | POA: Insufficient documentation

## 2017-07-23 DIAGNOSIS — F419 Anxiety disorder, unspecified: Secondary | ICD-10-CM | POA: Insufficient documentation

## 2017-07-23 MED ORDER — PREDNISONE 10 MG PO TABS: ORAL_TABLET | ORAL | 0 refills | Status: AC

## 2017-07-23 MED ORDER — DEXAMETHASONE SODIUM PHOSPHATE 10 MG/ML IJ SOLN
10.0000 mg | Freq: Once | INTRAMUSCULAR | Status: AC
  Administered 2017-07-23: 10 mg via INTRAMUSCULAR
  Filled 2017-07-23 (×2): qty 1

## 2017-07-23 MED ORDER — RANITIDINE HCL 150 MG PO TABS: 150.0000 mg | ORAL_TABLET | Freq: Two times a day (BID) | ORAL | 0 refills | Status: AC

## 2017-07-23 MED ORDER — FAMOTIDINE 20 MG PO TABS
20.0000 mg | ORAL_TABLET | Freq: Once | ORAL | Status: AC
Start: 2017-07-23 — End: 2017-07-23
  Administered 2017-07-23: 20 mg via ORAL
  Filled 2017-07-23: qty 1

## 2017-07-23 NOTE — ED Provider Notes (Signed)
[facility] Emergency Department Provider Note  ____________________________________________   First MD Initiated Contact with Patient 07/23/17 [phone]     (approximate)  I have reviewed the triage vital signs and the nursing notes.   HISTORY  Chief Complaint Rash   HPI Jordan Mcpherson is a 27 y.o. male is here with complaint of hive-like rash to his body for 1-2 days.  Patient states that this is the same rash that he was seen for in the ED last October.  Patient states that the injection and pills helped with that as long as he was taking the medication.  Currently he is taking Claritin as the Benadryl causes drowsiness.  He has not seen improvement just taking the Claritin and still continues to itch.  He did not follow-up with any one after his episode last year.  He denies any difficulty breathing or swallowing.   Past Medical History:  Diagnosis Date  . Anxiety   . Depression   . GERD (gastroesophageal reflux disease)    very rare-no meds  . Hx MRSA infection   . Kidney stones   . Renal disorder     There are no active problems to display for this patient.   Past Surgical History:  Procedure Laterality Date  . CYSTOSCOPY WITH STENT PLACEMENT Left 02/17/2016   Procedure: CYSTOSCOPY WITH STENT PLACEMENT;  Surgeon: Vanna Scotland, MD;  Location: [facility];  Service: Urology;  Laterality: Left;  . CYSTOSCOPY/URETEROSCOPY/HOLMIUM LASER/STENT PLACEMENT Right 11/15/2015   Procedure: CYSTOSCOPY/URETEROSCOPY/STENT PLACEMENT;  Surgeon: Vanna Scotland, MD;  Location: [facility];  Service: Urology;  Laterality: Right;  . CYSTOSCOPY/URETEROSCOPY/HOLMIUM LASER/STENT PLACEMENT Right 11/22/2015   Procedure: CYSTOSCOPY/URETEROSCOPY/HOLMIUM LASER/STENT EXCHANGE;  Surgeon: Vanna Scotland, MD;  Location: [facility];  Service: Urology;  Laterality: Right;  . EXTRACORPOREAL SHOCK WAVE LITHOTRIPSY Left 02/17/2016   Was not done because patient had toradol in recovery  .  none    . PILONIDAL CYST DRAINAGE      Prior to Admission medications   Medication Sig Start Date End Date Taking? Authorizing Provider  cetirizine (ZYRTEC HIVES RELIEF) 10 MG tablet Take 1 tablet (10 mg total) by mouth daily. 02/12/17   Rebecka Apley, MD  docusate sodium (COLACE) 100 MG capsule Take 1 capsule (100 mg total) by mouth 2 (two) times daily. 02/17/16   Vanna Scotland, MD  predniSONE (DELTASONE) 10 MG tablet Take 6 tablets  today, on day 2 take 5 tablets, day 3 take 4 tablets, day 4 take 3 tablets, day 5 take  2 tablets and 1 tablet the last day 07/23/17   Tommi Rumps, PA-C  ranitidine (ZANTAC) 150 MG tablet Take 1 tablet (150 mg total) by mouth 2 (two) times daily. 07/23/17 07/23/18  Tommi Rumps, PA-C    Allergies Patient has no known allergies.  Family History  Problem Relation Age of Onset  . Kidney Stones Father   . Prostate cancer Maternal Uncle   . Kidney disease Neg Hx     Social History Social History   Tobacco Use  . Smoking status: Current Every Day Smoker    Packs/day: 0.50    Years: 7.00    Pack years: 3.50    Types: Cigarettes  . Smokeless tobacco: Never Used  Substance Use Topics  . Alcohol use: No  . Drug use: No    Review of Systems Constitutional: No fever/chills Eyes: No visual changes. ENT: No sore throat.  Denies difficulty swallowing. Cardiovascular: Denies chest pain. Respiratory: Denies  shortness of breath.  No wheezing. Gastrointestinal:  No nausea, no vomiting.  Musculoskeletal: Negative for back pain. Skin: Positive for rash Neurological: Negative for headaches, focal weakness or numbness. ___________________________________________   PHYSICAL EXAM:  VITAL SIGNS: ED Triage Vitals [07/23/17 0725]  Enc Vitals Group     BP 126/75     Pulse Rate 87     Resp 18     Temp 97.7 F (36.5 C)     Temp Source Oral     SpO2 99 %     Weight 198 lb (89.8 kg)     Height 5\' 11"  (1.803 m)     Head Circumference       Peak Flow      Pain Score      Pain Loc      Pain Edu?      Excl. in [place]?    Constitutional: Alert and oriented. Well appearing and in no acute distress. Eyes: Conjunctivae are normal.  Head: Atraumatic. Nose: No congestion/rhinnorhea. Mouth/Throat: Mucous membranes are moist.  Oropharynx non-erythematous.  No edema present and uvula is midline. Neck: No stridor.   Hematological/Lymphatic/Immunilogical: No cervical lymphadenopathy. Cardiovascular: Normal rate, regular rhythm. Grossly normal heart sounds.  Good peripheral circulation. Respiratory: Normal respiratory effort.  No retractions. Lungs CTAB. Musculoskeletal: Moves upper and lower extremities without any difficulty.  Normal gait was noted. Neurologic:  Normal speech and language. No gross focal neurologic deficits are appreciated.  Skin:  Skin is warm, dry and intact.  Erythematous irregular macular areas are noted to the trunk and extremities.  Some lesions have areas of clearing.  Others are fading while there are areas that are extremely erythematous.  Nontender to palpation.  No drainage is present. Psychiatric: Mood and affect are normal. Speech and behavior are normal.  ____________________________________________   LABS (all labs ordered are listed, but only abnormal results are displayed)  Labs Reviewed - No data to display   PROCEDURES  Procedure(s) performed: None  Procedures  Critical Care performed: No  ____________________________________________   INITIAL IMPRESSION / ASSESSMENT AND PLAN / ED COURSE Patient was given Decadron 10 mg IM in the department.  He was also discharged with prescription for prednisone starting with 60 mg and tapering down 1 tablet each day.  He will continue taking Claritin as this does not cause any drowsiness.  He was given information to follow-up with [facility] for allergy testing since he continues to have hives off and  on.  ____________________________________________   FINAL CLINICAL IMPRESSION(S) / ED DIAGNOSES  Final diagnoses:  Urticaria     ED Discharge Orders        Ordered    predniSONE (DELTASONE) 10 MG tablet     07/23/17 0924    ranitidine (ZANTAC) 150 MG tablet  2 times daily     07/23/17 [id number]       Note:  This document was prepared using Dragon voice recognition software and may include unintentional dictation errors.    Tommi RumpsSummers, Alyxandria Wentz L, PA-C 07/23/17 1547    Emily FilbertWilliams, Jonathan E, MD 07/24/17 [phone]

## 2017-07-23 NOTE — Discharge Instructions (Signed)
Continue taking Claritin as directed.  You need to follow-up with [facility] for allergy testing.  Begin taking prednisone tapering dose beginning with 6 tablets today and tapering down by 1.  Also ranitidine 150 mg twice daily is to help with your rash as well. Return to the emergency department if any severe worsening of your symptoms or difficulty breathing.

## 2017-07-23 NOTE — ED Triage Notes (Signed)
Hive like rash to body x 1 day.  States has been seen through ED in the past for same and was treated with prednisone.    Lungs CTA.  No SOB/ DOE.

## 2017-07-23 NOTE — ED Notes (Signed)
See triage note.  Presents with generalized rash   States rash started about 1 day ago  No resp issues noted

## 2018-05-13 ENCOUNTER — Other Ambulatory Visit: Payer: Self-pay

## 2018-05-13 ENCOUNTER — Emergency Department: Payer: BLUE CROSS/BLUE SHIELD

## 2018-05-13 ENCOUNTER — Encounter: Payer: Self-pay | Admitting: Emergency Medicine

## 2018-05-13 ENCOUNTER — Emergency Department
Admission: EM | Admit: 2018-05-13 | Discharge: 2018-05-14 | Discharge status: Against Medical Advice | Payer: BLUE CROSS/BLUE SHIELD | Attending: Emergency Medicine | Admitting: Emergency Medicine

## 2018-05-13 DIAGNOSIS — R911 Solitary pulmonary nodule: Secondary | ICD-10-CM | POA: Insufficient documentation

## 2018-05-13 DIAGNOSIS — R079 Chest pain, unspecified: Secondary | ICD-10-CM | POA: Diagnosis present

## 2018-05-13 DIAGNOSIS — R091 Pleurisy: Secondary | ICD-10-CM | POA: Diagnosis not present

## 2018-05-13 DIAGNOSIS — R0602 Shortness of breath: Secondary | ICD-10-CM | POA: Diagnosis not present

## 2018-05-13 DIAGNOSIS — F17210 Nicotine dependence, cigarettes, uncomplicated: Secondary | ICD-10-CM | POA: Diagnosis not present

## 2018-05-13 LAB — CBC WITH DIFFERENTIAL/PLATELET
ABS IMMATURE GRANULOCYTES: 0.02 10*3/uL (ref 0.00–0.07)
Basophils Absolute: 0 10*3/uL (ref 0.0–0.1)
Basophils Relative: 1 %
EOS PCT: 4 %
Eosinophils Absolute: 0.3 10*3/uL (ref 0.0–0.5)
HCT: 43.6 % (ref 39.0–52.0)
HEMOGLOBIN: 14.9 g/dL (ref 13.0–17.0)
Immature Granulocytes: 0 %
Lymphocytes Relative: 27 %
Lymphs Abs: 2.3 10*3/uL (ref 0.7–4.0)
MCH: 29.6 pg (ref 26.0–34.0)
MCHC: 34.2 g/dL (ref 30.0–36.0)
MCV: 86.5 fL (ref 80.0–100.0)
MONO ABS: 0.5 10*3/uL (ref 0.1–1.0)
MONOS PCT: 6 %
NEUTROS ABS: 5.2 10*3/uL (ref 1.7–7.7)
Neutrophils Relative %: 62 %
Platelets: 250 10*3/uL (ref 150–400)
RBC: 5.04 MIL/uL (ref 4.22–5.81)
RDW: 11.9 % (ref 11.5–15.5)
WBC: 8.3 10*3/uL (ref 4.0–10.5)
nRBC: 0 % (ref 0.0–0.2)

## 2018-05-13 LAB — COMPREHENSIVE METABOLIC PANEL
ALK PHOS: 61 U/L (ref 38–126)
ALT: 15 U/L (ref 0–44)
AST: 16 U/L (ref 15–41)
Albumin: 4.3 g/dL (ref 3.5–5.0)
Anion gap: 6 (ref 5–15)
BILIRUBIN TOTAL: 0.5 mg/dL (ref 0.3–1.2)
BUN: 18 mg/dL (ref 6–20)
CALCIUM: 9.3 mg/dL (ref 8.9–10.3)
CO2: 26 mmol/L (ref 22–32)
CREATININE: 0.91 mg/dL (ref 0.61–1.24)
Chloride: 107 mmol/L (ref 98–111)
Glucose, Bld: 89 mg/dL (ref 70–99)
Potassium: 4.4 mmol/L (ref 3.5–5.1)
Sodium: 139 mmol/L (ref 135–145)
TOTAL PROTEIN: 6.9 g/dL (ref 6.5–8.1)

## 2018-05-13 LAB — TROPONIN I

## 2018-05-13 NOTE — ED Triage Notes (Signed)
Patient ambulatory to triage with steady gait, without difficulty or distress noted; pt reports mid upper CP accomp by SHOB since this am; denies hx of same 

## 2018-05-13 NOTE — ED Provider Notes (Signed)
[facility] Emergency Department Provider Note  ____________________________________________   First MD Initiated Contact with Patient 05/13/18 2359     (approximate)  I have reviewed the triage vital signs and the nursing notes.   HISTORY  Chief Complaint Chest Pain   HPI Trey SailorsChristopher R Lata is a 27 y.o. male self presents to the emergency department with upper chest pain and shortness of breath that began gradually this morning while at work.   His pain is described as sharp and constant and seems to be worse when taking a deep breath and somewhat improved when resting.  It is associated with shortness of breath.  He feels like when he exerts himself he cannot fully catch his breath.  He does have a longstanding history of anxiety and depression although has never had chest pain like this before.  He has no history of DVT or pulmonary embolism.  No recent surgery travel or immobilization.  No leg swelling.  No hemoptysis.  His symptoms are improved when lying flat and slightly worse when sitting up.  No recent infectious symptoms.  No fevers or chills.  No rhinorrhea.  No cough.  The pain is not ripping or tearing it does not go straight to his back.  He has not been vomiting or retching.   Past Medical History:  Diagnosis Date  . Anxiety   . Depression   . GERD (gastroesophageal reflux disease)    very rare-no meds  . Hx MRSA infection   . Kidney stones   . Renal disorder     There are no active problems to display for this patient.   Past Surgical History:  Procedure Laterality Date  . CYSTOSCOPY WITH STENT PLACEMENT Left 02/17/2016   Procedure: CYSTOSCOPY WITH STENT PLACEMENT;  Surgeon: Vanna ScotlandAshley Brandon, MD;  Location: [facility];  Service: Urology;  Laterality: Left;  . CYSTOSCOPY/URETEROSCOPY/HOLMIUM LASER/STENT PLACEMENT Right 11/15/2015   Procedure: CYSTOSCOPY/URETEROSCOPY/STENT PLACEMENT;  Surgeon: Vanna ScotlandAshley Brandon, MD;  Location: [facility];   Service: Urology;  Laterality: Right;  . CYSTOSCOPY/URETEROSCOPY/HOLMIUM LASER/STENT PLACEMENT Right 11/22/2015   Procedure: CYSTOSCOPY/URETEROSCOPY/HOLMIUM LASER/STENT EXCHANGE;  Surgeon: Vanna ScotlandAshley Brandon, MD;  Location: [facility];  Service: Urology;  Laterality: Right;  . EXTRACORPOREAL SHOCK WAVE LITHOTRIPSY Left 02/17/2016   Was not done because patient had toradol in recovery  . none    . PILONIDAL CYST DRAINAGE      Prior to Admission medications   Medication Sig Start Date End Date Taking? Authorizing Provider  cetirizine (ZYRTEC HIVES RELIEF) 10 MG tablet Take 1 tablet (10 mg total) by mouth daily. 02/12/17   Rebecka ApleyWebster, Allison P, MD  docusate sodium (COLACE) 100 MG capsule Take 1 capsule (100 mg total) by mouth 2 (two) times daily. 02/17/16   Vanna ScotlandBrandon, Ashley, MD  ibuprofen (ADVIL,MOTRIN) 600 MG tablet Take 1 tablet (600 mg total) by mouth every 8 (eight) hours as needed. 05/14/18   Merrily Brittleifenbark, Arohi Salvatierra, MD  predniSONE (DELTASONE) 10 MG tablet Take 6 tablets  today, on day 2 take 5 tablets, day 3 take 4 tablets, day 4 take 3 tablets, day 5 take  2 tablets and 1 tablet the last day 07/23/17   Tommi RumpsSummers, Rhonda L, PA-C  ranitidine (ZANTAC) 150 MG tablet Take 1 tablet (150 mg total) by mouth 2 (two) times daily. 07/23/17 07/23/18  Tommi RumpsSummers, Rhonda L, PA-C    Allergies Patient has no known allergies.  Family History  Problem Relation Age of Onset  . Kidney Stones Father   . Prostate cancer Maternal  Uncle   . Kidney disease Neg Hx     Social History Social History   Tobacco Use  . Smoking status: Current Every Day Smoker    Packs/day: 0.50    Years: 7.00    Pack years: 3.50    Types: Cigarettes  . Smokeless tobacco: Never Used  Substance Use Topics  . Alcohol use: No  . Drug use: No    Review of Systems Constitutional: No fever/chills Eyes: No visual changes. ENT: No sore throat. Cardiovascular: Positive for chest pain. Respiratory: Positive for shortness of breath. Gastrointestinal:  No abdominal pain.  No nausea, no vomiting.  No diarrhea.  No constipation. Genitourinary: Negative for dysuria. Musculoskeletal: Negative for back pain. Skin: Negative for rash. Neurological: Negative for headaches, focal weakness or numbness.   ____________________________________________   PHYSICAL EXAM:  VITAL SIGNS: ED Triage Vitals  Enc Vitals Group     BP 05/13/18 1938 139/80     Pulse Rate 05/13/18 1938 64     Resp 05/13/18 1938 18     Temp 05/13/18 1938 98.1 F (36.7 C)     Temp Source 05/13/18 1938 Oral     SpO2 05/13/18 1938 98 %     Weight 05/13/18 1939 182 lb (82.6 kg)     Height 05/13/18 1939 5\' 11"  (1.803 m)     Head Circumference --      Peak Flow --      Pain Score 05/13/18 1938 6     Pain Loc --      Pain Edu? --      Excl. in [place]? --     Constitutional: Alert and oriented x4 pleasant cooperative speaks in full clear sentences no diaphoresis Eyes: PERRL EOMI. Head: Atraumatic. Nose: No congestion/rhinnorhea. Mouth/Throat: No trismus Neck: No stridor.  Able to lie completely flat with no JVD Cardiovascular: Normal rate, regular rhythm. Grossly normal heart sounds.  Good peripheral circulation.  Chest wall stable although some right anterior chest tenderness Respiratory: Normal respiratory effort.  No retractions. Lungs CTAB and moving good air Gastrointestinal: Soft nontender Musculoskeletal: No lower extremity edema.  Legs are equal in size Neurologic:  Normal speech and language. No gross focal neurologic deficits are appreciated. Skin:  Skin is warm, dry and intact. No rash noted. Psychiatric: Mood and affect are normal. Speech and behavior are normal.    ____________________________________________   DIFFERENTIAL includes but not limited to  Pulmonary embolism, pneumonia, panic attack, acute coronary syndrome, vasospasm, Boerhaave syndrome, costochondritis ____________________________________________   LABS (all labs ordered are listed, but  only abnormal results are displayed)  Labs Reviewed  CBC WITH DIFFERENTIAL/PLATELET  COMPREHENSIVE METABOLIC PANEL  TROPONIN I  TROPONIN I  BRAIN NATRIURETIC PEPTIDE    Lab work reviewed by me with no sign of acute ischemia __________________________________________  EKG  ED ECG REPORT I, Merrily BrittleNeil Jessy Cybulski, the attending physician, personally viewed and interpreted this ECG.  Date: 05/13/2018 EKG Time:  Rate: 66 Rhythm: normal sinus rhythm QRS Axis: normal Intervals: normal ST/T Wave abnormalities: normal Narrative Interpretation: no evidence of acute ischemia  ____________________________________________  RADIOLOGY  Chest x-ray reviewed by me with no acute disease ____________________________________________   PROCEDURES  Procedure(s) performed: no  Procedures  Critical Care performed: no  ____________________________________________   INITIAL IMPRESSION / ASSESSMENT AND PLAN / ED COURSE  Pertinent labs & imaging results that were available during my care of the patient were reviewed by me and considered in my medical decision making (see chart for details).   As  part of my medical decision making, I reviewed the following data within the electronic MEDICAL RECORD NUMBER History obtained from family if available, nursing notes, old chart and ekg, as well as notes from prior ED visits.  The patient is mildly anxious appearing although nontoxic.  He has pleuritic chest pain and shortness of breath.  Is somewhat positional and actually improved when lying flat and worsened when sitting up and consistent with pericarditis.  His EKG today is abnormal and different from his previous EKG showing a Q wave and T wave inversion in lead III with perhaps a subtle S wave in lead I.  We discussed the risks and benefits of CT scan and the patient agrees he would benefit from a CT today given his pleuritic sounding chest pain and abnormal EKG.      ----------------------------------------- 2:32 AM on 05/14/2018 -----------------------------------------  Fortunately the patient's CT scan is negative for acute pathology.  He is a cigarette smoker and his CT shows a pulmonary nodule which I have disclosed to the patient.  He does not have a primary care physician so I will help him obtain 1 to follow this up.  Following ibuprofen and Tylenol the patient symptoms are essentially resolved and he mostly feels significant reassurance.  He is discharged home in improved condition verbalizes understanding and agreement with the plan. ____________________________________________   FINAL CLINICAL IMPRESSION(S) / ED DIAGNOSES  Final diagnoses:  Pleurisy  Pulmonary nodule      NEW MEDICATIONS STARTED DURING THIS VISIT:  Discharge Medication List as of 05/14/2018  2:32 AM    START taking these medications   Details  ibuprofen (ADVIL,MOTRIN) 600 MG tablet Take 1 tablet (600 mg total) by mouth every 8 (eight) hours as needed., Starting Tue 05/14/2018, Print         Note:  This document was prepared using Dragon voice recognition software and may include unintentional dictation errors.    Merrily Brittle, MD 05/17/18 [phone]

## 2018-05-13 NOTE — ED Notes (Signed)
Pt returned stating he has been in the lobby.

## 2018-05-14 ENCOUNTER — Encounter: Payer: Self-pay | Admitting: Radiology

## 2018-05-14 ENCOUNTER — Emergency Department: Payer: BLUE CROSS/BLUE SHIELD

## 2018-05-14 LAB — TROPONIN I

## 2018-05-14 LAB — BRAIN NATRIURETIC PEPTIDE: B NATRIURETIC PEPTIDE 5: 10 pg/mL (ref 0.0–100.0)

## 2018-05-14 MED ORDER — IBUPROFEN 600 MG PO TABS: 600.0000 mg | ORAL_TABLET | Freq: Three times a day (TID) | ORAL | 0 refills | Status: AC | PRN

## 2018-05-14 MED ORDER — IBUPROFEN 600 MG PO TABS
600.0000 mg | ORAL_TABLET | Freq: Once | ORAL | Status: AC
  Administered 2018-05-14: 600 mg via ORAL
  Filled 2018-05-14: qty 1

## 2018-05-14 MED ORDER — ACETAMINOPHEN 500 MG PO TABS
1000.0000 mg | ORAL_TABLET | Freq: Once | ORAL | Status: AC
  Administered 2018-05-14: 1000 mg via ORAL
  Filled 2018-05-14: qty 2

## 2018-05-14 MED ORDER — IOHEXOL 350 MG/ML SOLN
75.0000 mL | Freq: Once | INTRAVENOUS | Status: AC | PRN
  Administered 2018-05-14: 75 mL via INTRAVENOUS

## 2018-05-14 NOTE — Discharge Instructions (Signed)
Fortunately today your CT scan and your blood work were reassuring.  Your CT scan did show a small nodule on your right lung that we discussed.  It is most likely that this means absolutely nothing although because you are a smoker is very important that you follow-up with primary care to get this rechecked.  Please return to the emergency department for any concerns whatsoever.  It was a pleasure to take care of you today, and thank you for coming to our emergency department.  If you have any questions or concerns before leaving please ask the nurse to grab me and I'm more than happy to go through your aftercare instructions again.  If you were prescribed any opioid pain medication today such as Norco, Vicodin, Percocet, morphine, hydrocodone, or oxycodone please make sure you do not drive when you are taking this medication as it can alter your ability to drive safely.  If you have any concerns once you are home that you are not improving or are in fact getting worse before you can make it to your follow-up appointment, please do not hesitate to call [phone] and come back for further evaluation.  Merrily BrittleNeil Minor Iden, MD  Results for orders placed or performed during the hospital encounter of 05/13/18  CBC with Differential  Result Value Ref Range   WBC 8.3 4.0 - 10.5 K/uL   RBC 5.04 4.22 - 5.81 MIL/uL   Hemoglobin 14.9 13.0 - 17.0 g/dL   HCT 40.943.6 81.139.0 - 91.452.0 %   MCV 86.5 80.0 - 100.0 fL   MCH 29.6 26.0 - 34.0 pg   MCHC 34.2 30.0 - 36.0 g/dL   RDW 78.211.9 95.611.5 - 21.315.5 %   Platelets 250 150 - 400 K/uL   nRBC 0.0 0.0 - 0.2 %   Neutrophils Relative % 62 %   Neutro Abs 5.2 1.7 - 7.7 K/uL   Lymphocytes Relative 27 %   Lymphs Abs 2.3 0.7 - 4.0 K/uL   Monocytes Relative 6 %   Monocytes Absolute 0.5 0.1 - 1.0 K/uL   Eosinophils Relative 4 %   Eosinophils Absolute 0.3 0.0 - 0.5 K/uL   Basophils Relative 1 %   Basophils Absolute 0.0 0.0 - 0.1 K/uL   Immature Granulocytes 0 %   Abs Immature Granulocytes  0.02 0.00 - 0.07 K/uL  Comprehensive metabolic panel  Result Value Ref Range   Sodium 139 135 - 145 mmol/L   Potassium 4.4 3.5 - 5.1 mmol/L   Chloride 107 98 - 111 mmol/L   CO2 26 22 - 32 mmol/L   Glucose, Bld 89 70 - 99 mg/dL   BUN 18 6 - 20 mg/dL   Creatinine, Ser 0.860.91 0.61 - 1.24 mg/dL   Calcium 9.3 8.9 - 57.810.3 mg/dL   Total Protein 6.9 6.5 - 8.1 g/dL   Albumin 4.3 3.5 - 5.0 g/dL   AST 16 15 - 41 U/L   ALT 15 0 - 44 U/L   Alkaline Phosphatase 61 38 - 126 U/L   Total Bilirubin 0.5 0.3 - 1.2 mg/dL   GFR calc non Af Amer >60 >60 mL/min   GFR calc Af Amer >60 >60 mL/min   Anion gap 6 5 - 15  Troponin I - ONCE - STAT  Result Value Ref Range   Troponin I <0.03 <0.03 ng/mL  Troponin I - Once  Result Value Ref Range   Troponin I <0.03 <0.03 ng/mL  Brain natriuretic peptide  Result Value Ref Range   B  Natriuretic Peptide 10.0 0.0 - 100.0 pg/mL   Dg Chest 2 View  Result Date: 05/13/2018 CLINICAL DATA:  Shortness of breath and chest pain EXAM: CHEST - 2 VIEW COMPARISON:  None. FINDINGS: Lungs are clear. The heart size and pulmonary vascularity are normal. No adenopathy. No pneumothorax. No bone lesions. IMPRESSION: No edema or consolidation. Electronically Signed   By: Bretta BangWilliam  Woodruff III M.D.   On: 05/13/2018 19:59   Ct Angio Chest Pe W/cm &/or Wo Cm  Result Date: 05/14/2018 CLINICAL DATA:  Chest pain and dyspnea since this morning. Current smoker. EXAM: CT ANGIOGRAPHY CHEST WITH CONTRAST TECHNIQUE: Multidetector CT imaging of the chest was performed using the standard protocol during bolus administration of intravenous contrast. Multiplanar CT image reconstructions and MIPs were obtained to evaluate the vascular anatomy. CONTRAST:  75mL OMNIPAQUE IOHEXOL 350 MG/ML SOLN COMPARISON:  Chest radiograph from earlier today. FINDINGS: Cardiovascular: The study is high quality for the evaluation of pulmonary embolism. There are no filling defects in the central, lobar, segmental or  subsegmental pulmonary artery branches to suggest acute pulmonary embolism. Great vessels are normal in course and caliber. Normal heart size. No significant pericardial fluid/thickening. Mediastinum/Nodes: No discrete thyroid nodules. Unremarkable esophagus. No pathologically enlarged axillary, mediastinal or hilar lymph nodes. Lungs/Pleura: No pneumothorax. No pleural effusion. No acute consolidative airspace disease or lung masses. Subpleural peripheral right middle lobe 4 mm solid pulmonary nodule (series 6/image 61). No additional significant pulmonary nodules. Upper abdomen: No acute abnormality. Musculoskeletal:  No aggressive appearing focal osseous lesions. Review of the MIP images confirms the above findings. IMPRESSION: 1. No pulmonary embolism. 2. Solitary 4 mm subpleural right middle lobe pulmonary nodule. No follow-up needed if patient is low-risk. Non-contrast chest CT can be considered in 12 months if patient is high-risk. This recommendation follows the consensus statement: Guidelines for Management of Incidental Pulmonary Nodules Detected on CT Images:From the Fleischner Society 2017; published online before print (10.1148/[id number]). Electronically Signed   By: Delbert PhenixJason A Poff M.D.   On: 05/14/2018 01:31

## 2018-05-22 ENCOUNTER — Other Ambulatory Visit: Payer: Self-pay | Admitting: Family Medicine

## 2018-05-22 DIAGNOSIS — Z87898 Personal history of other specified conditions: Secondary | ICD-10-CM

## 2018-05-31 ENCOUNTER — Ambulatory Visit: Admission: RE | Admit: 2018-05-31 | Payer: BLUE CROSS/BLUE SHIELD | Source: Ambulatory Visit

## 2018-06-19 IMAGING — CR DG ABDOMEN 1V
1 series · 2 of 2 positions shown · non-contrast
Comparison: 10/31/2015.  CT 10/28/2015

CLINICAL DATA: History kidney stones.

EXAM:
ABDOMEN - 1 VIEW

[Series 1: dg abd 1 view · 0.14mm/px · 2 of 2 slices shown]
[im 1/2]
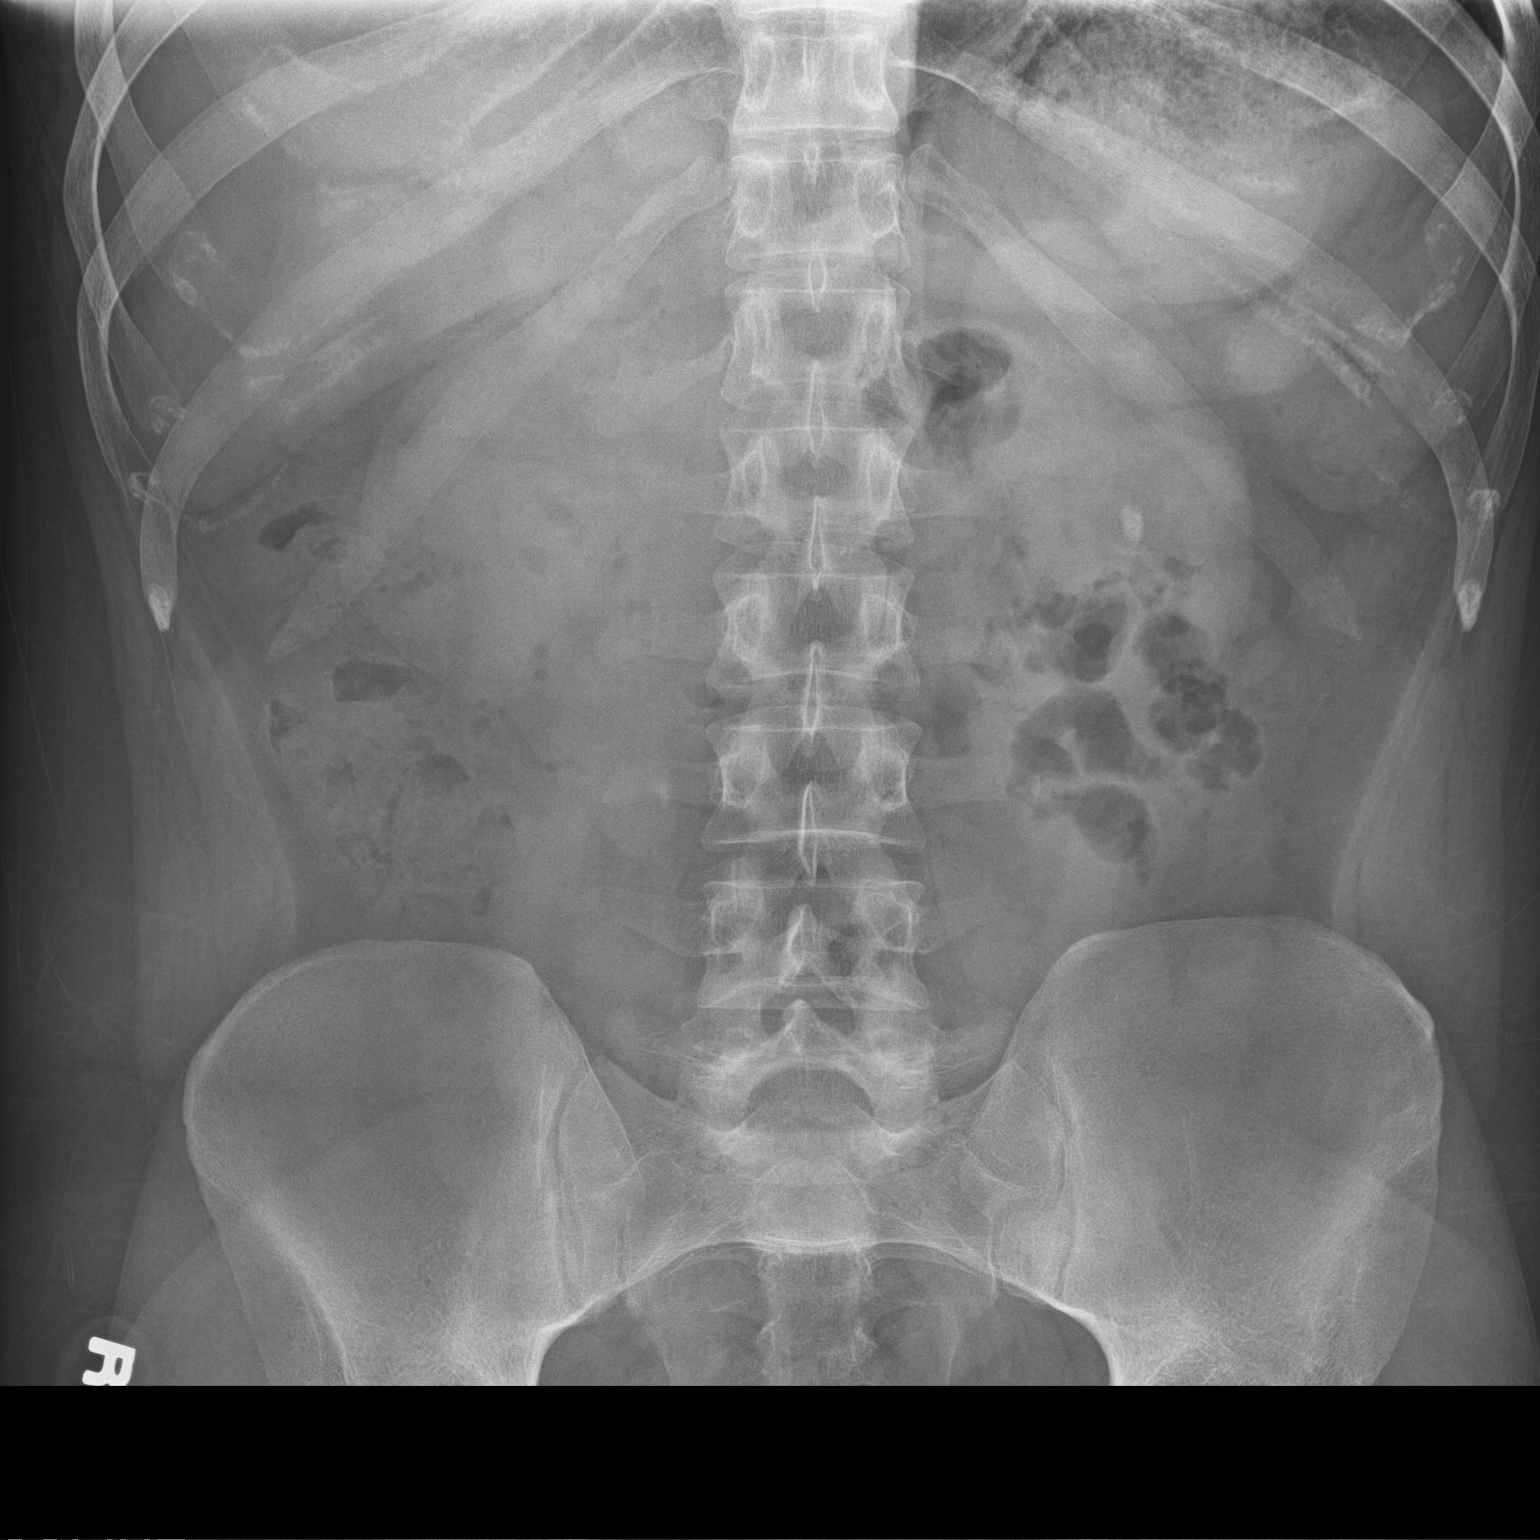
[im 2/2]
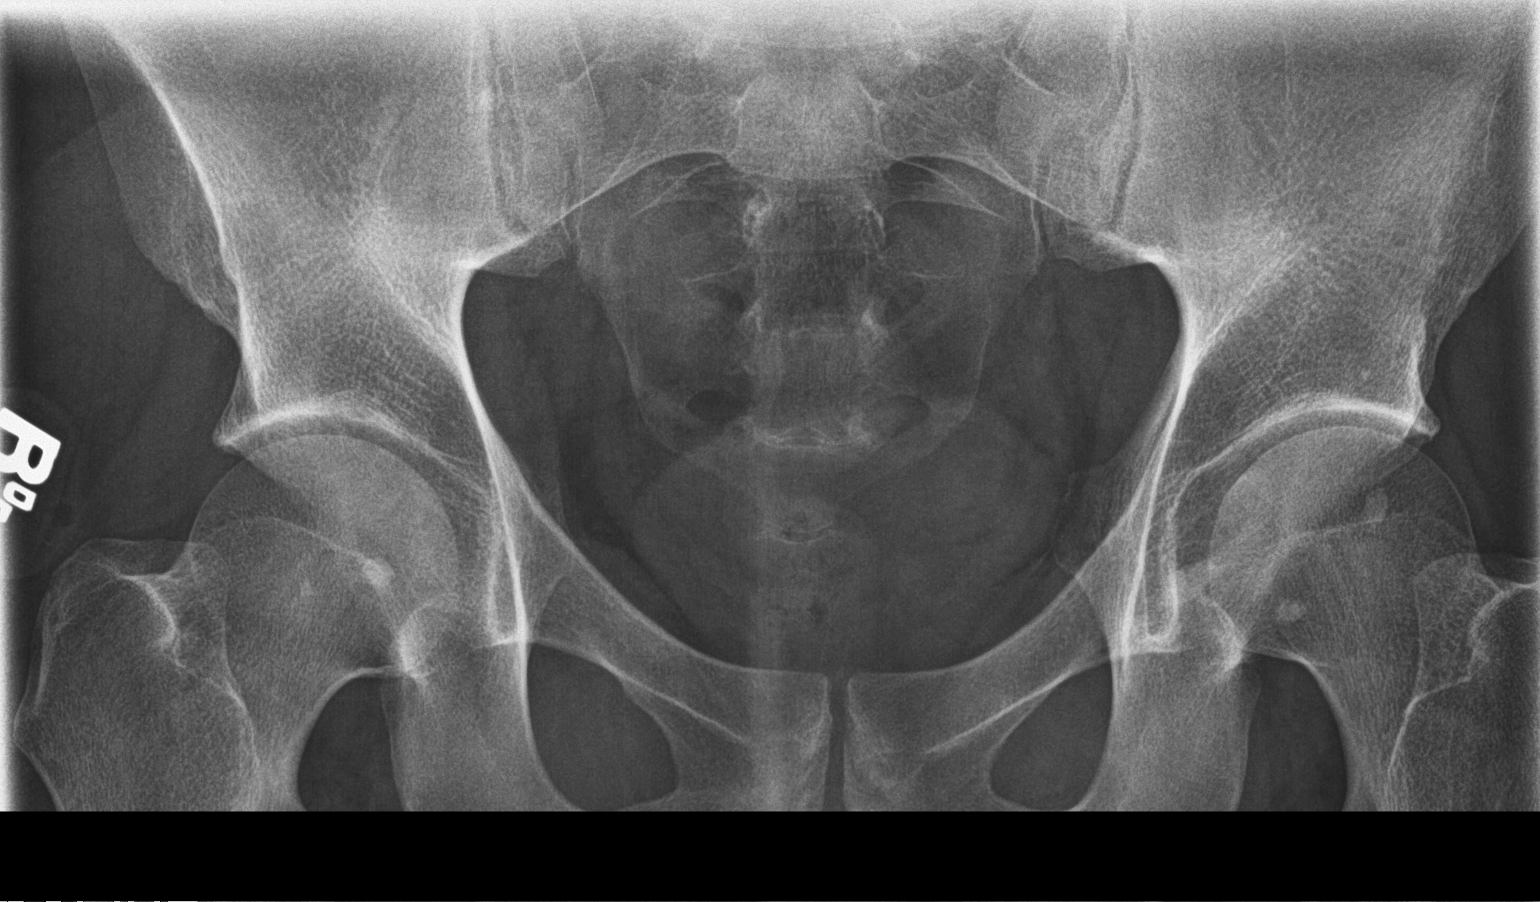

[2 of 2 positions shown; findings below may reference images not displayed]

FINDINGS: Stable changes of left nephrolithiasis and right urolithiasis noted.
Calcific density at the level of the right mid ureter is again noted
in unchanged. No bowel distention. No acute bony abnormality.
IMPRESSION: 1. Persistent right urolithiasis without interim change. Right
ureteral stone appears to remain in unchanged position in the right
mid ureter.

2.  Unchanged left nephrolithiasis .

## 2018-10-05 IMAGING — CR DG ABDOMEN 1V
1 series · 2 of 2 positions shown · non-contrast
Comparison: February 17, 2016 the previously noted calculus in the
mid left kidney is no longer evident.

CLINICAL DATA: Recent nephrolithiasis with double-J stent placement

EXAM:
ABDOMEN - 1 VIEW

[Series 1: dg abd 1 view · 0.14mm/px · 2 of 2 slices shown]
[im 1/2]
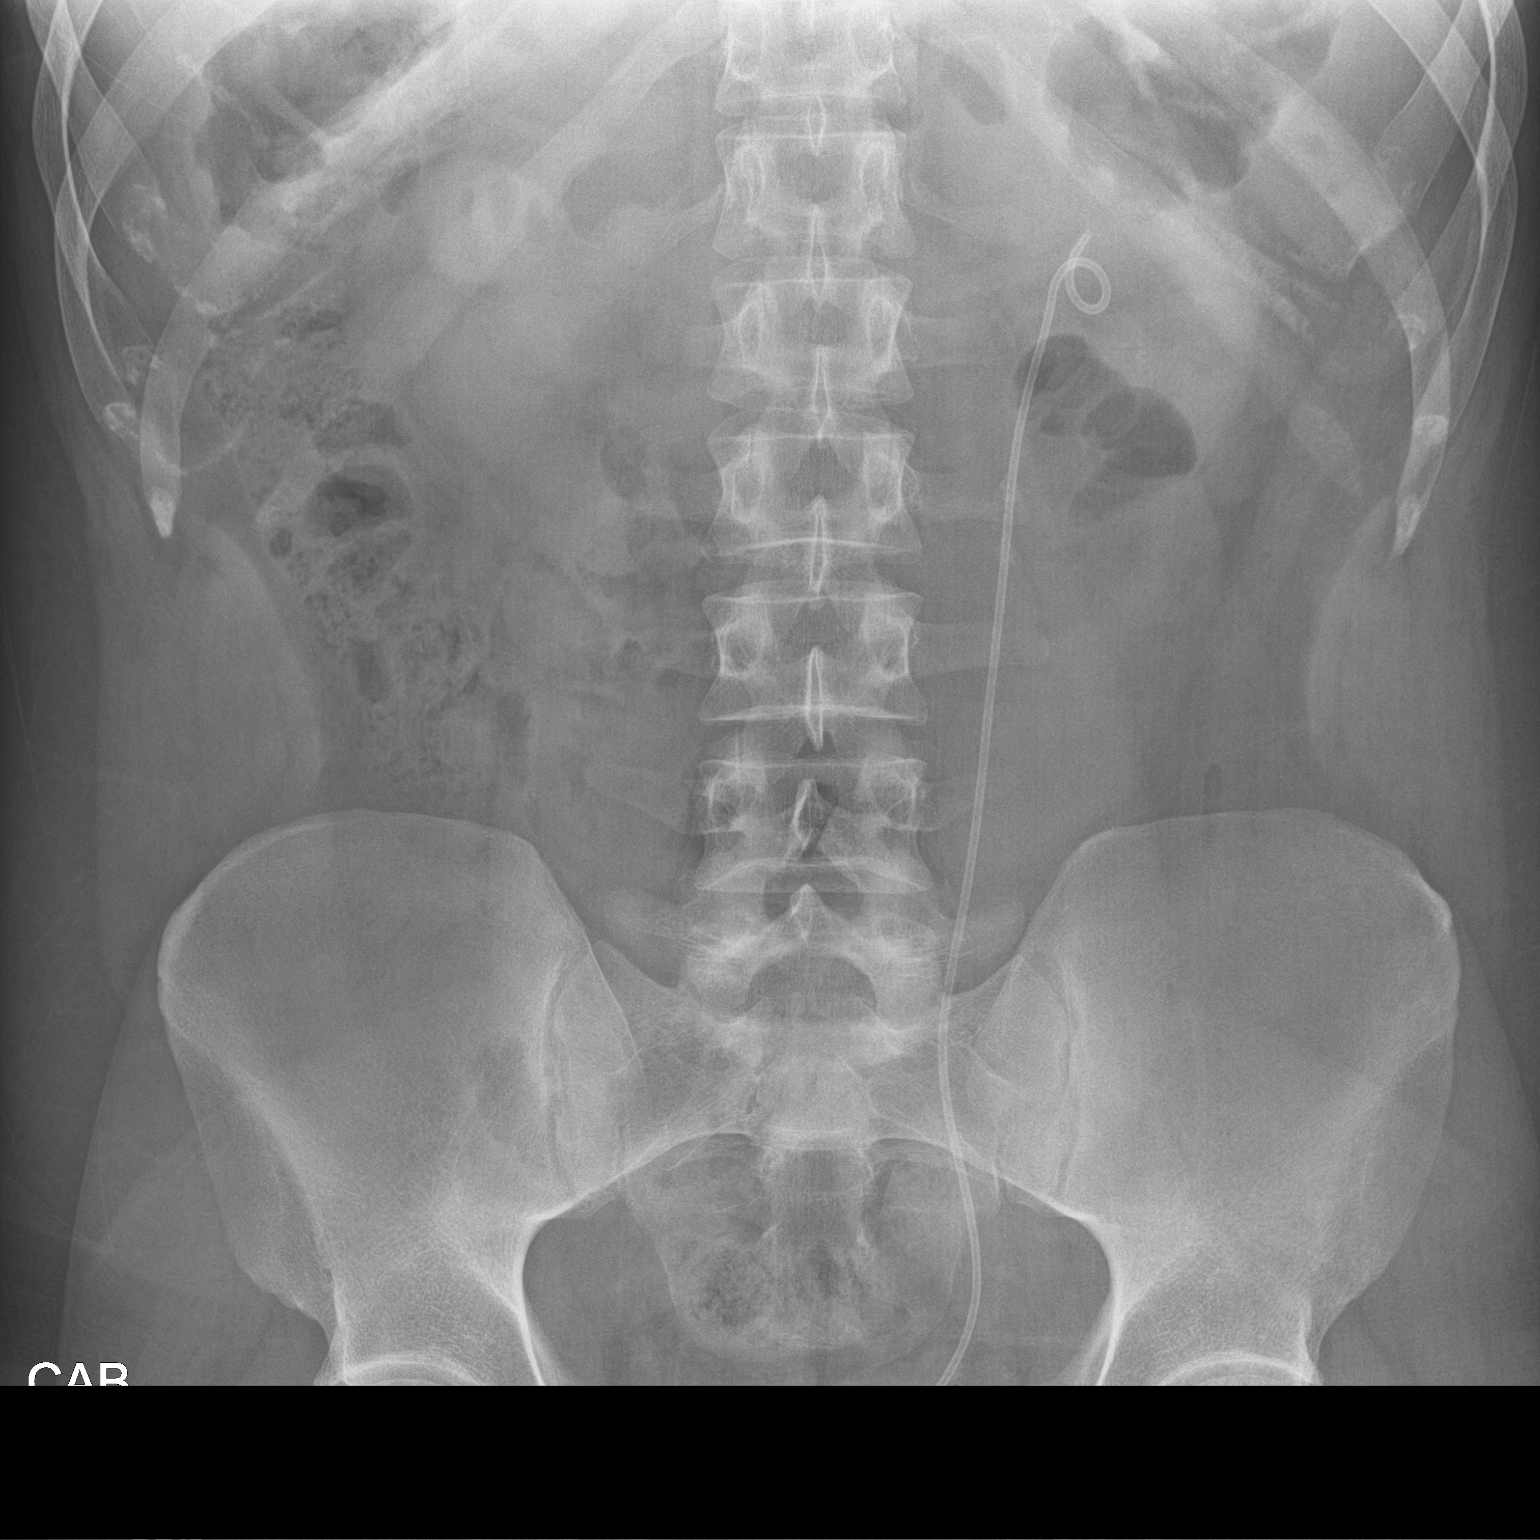
[im 2/2]
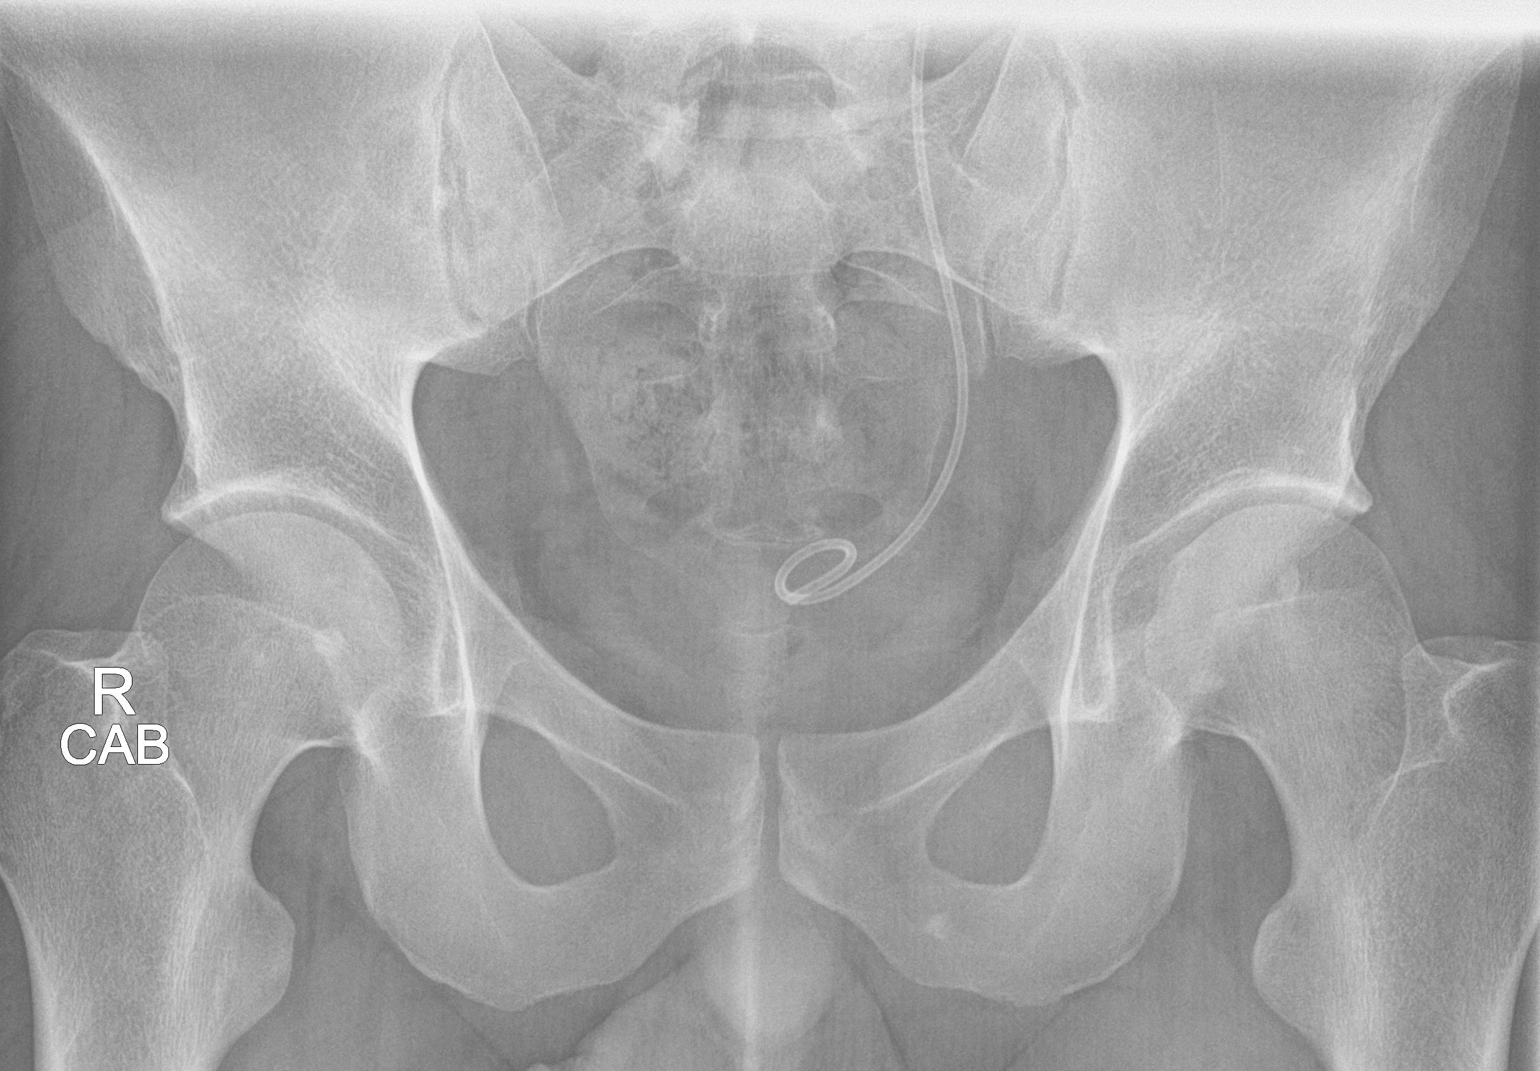

[2 of 2 positions shown; findings below may reference images not displayed]

There is a double-J stent
extending from the left renal pelvis bladder. No new calcifications
are evident. There is moderate stool throughout colon. No bowel
obstruction or free air.
FINDINGS: Double-J stent now present on the left extending from the left renal
pelvis bladder. Previously noted left renal calculus no longer
evident. No abnormal calcifications evident currently. Bowel gas
pattern unremarkable.
IMPRESSION: Negative.

## 2019-06-13 ENCOUNTER — Ambulatory Visit
Admission: RE | Admit: 2019-06-13 | Discharge: 2019-06-13 | Disposition: A | Discharge status: Home / Self Care | Payer: Self-pay | Source: Ambulatory Visit | Attending: Family Medicine | Admitting: Family Medicine

## 2019-06-13 ENCOUNTER — Other Ambulatory Visit: Payer: Self-pay

## 2019-06-13 DIAGNOSIS — Z87898 Personal history of other specified conditions: Secondary | ICD-10-CM

## 2022-01-21 IMAGING — CT CT CHEST W/O CM
1 of 2 series · 14 of 32 positions shown, 18 images · non-contrast
Comparison: 05/14/2018

CLINICAL DATA: Follow-up right middle lobe pulmonary nodule,
shortness of breath

EXAM:
CT CHEST WITHOUT CONTRAST
TECHNIQUE: Multidetector CT imaging of the chest was performed following the
standard protocol without IV contrast.

[Series 2: thorax · axial · 0.75mm/px · z∈[-668,-402]mm · 14 of 159 slices shown, 18 images]
[im 13/159  mediastinal]
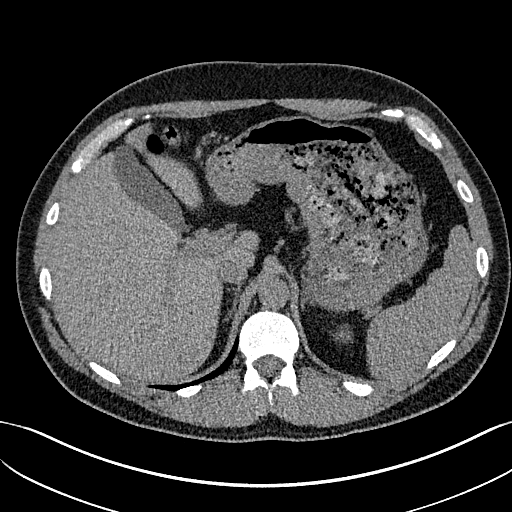
[im 13/159  lung]
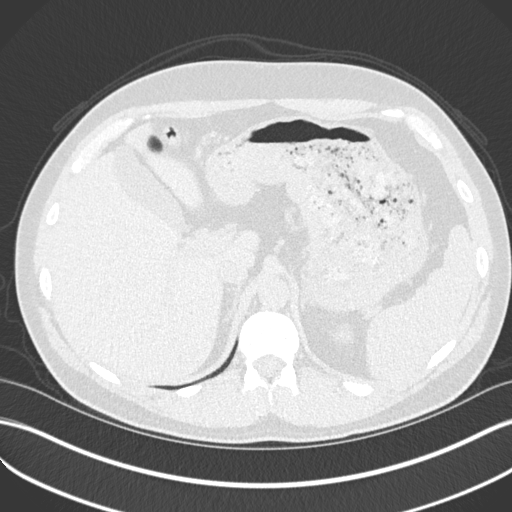
[im 25/159  lung]
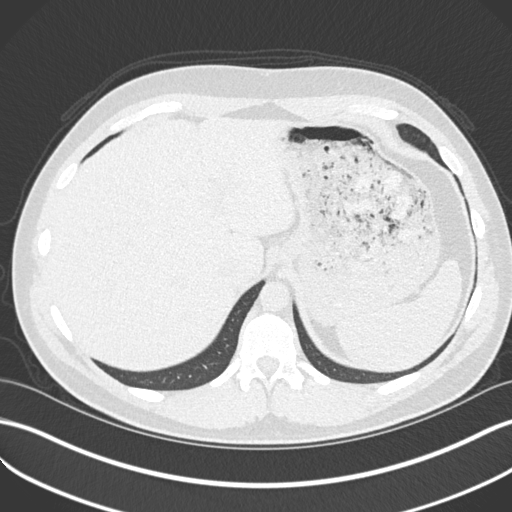
[im 37/159  lung]
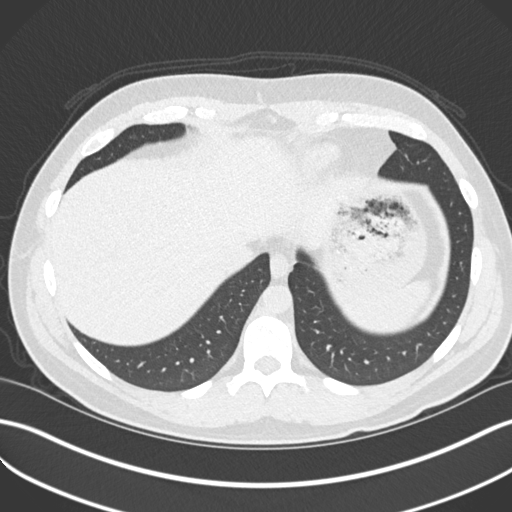
[im 49/159  lung]
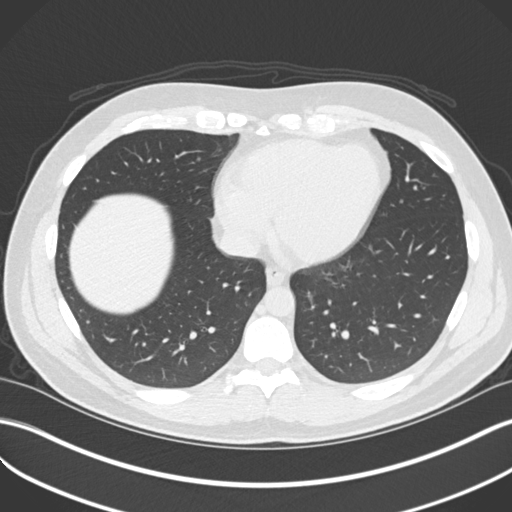
[im 61/159  mediastinal]
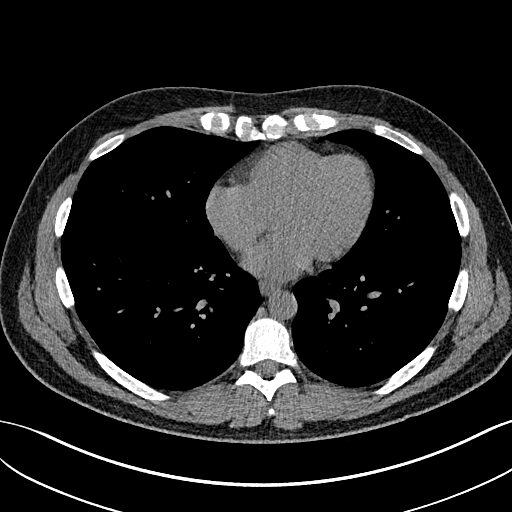
[im 61/159  lung]
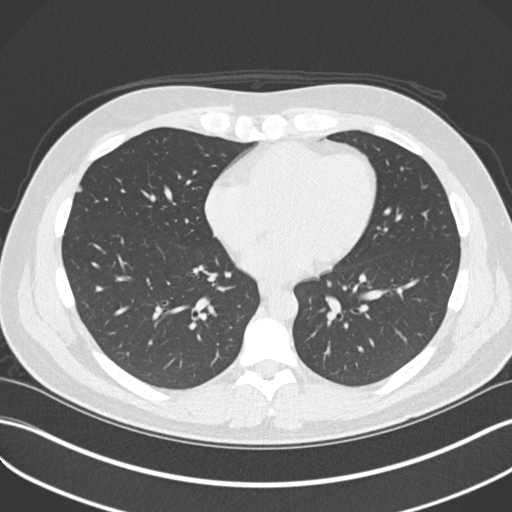
[im 73/159  lung]
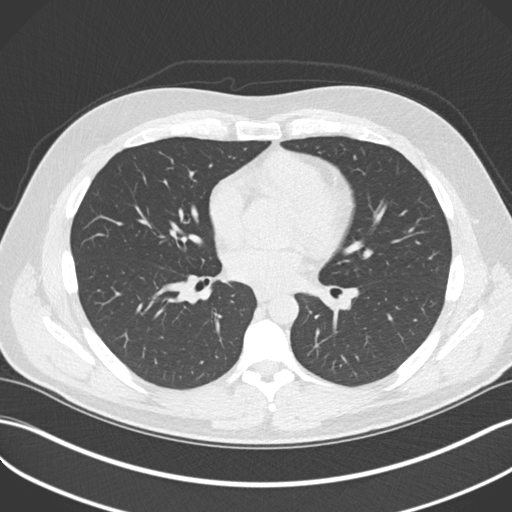
[im 75/159  lung]
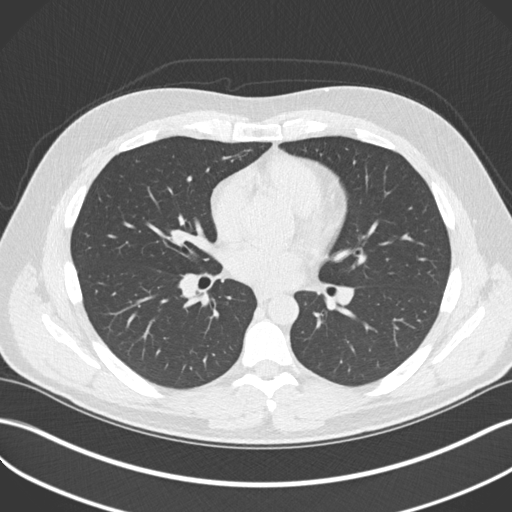
[im 80/159  lung]
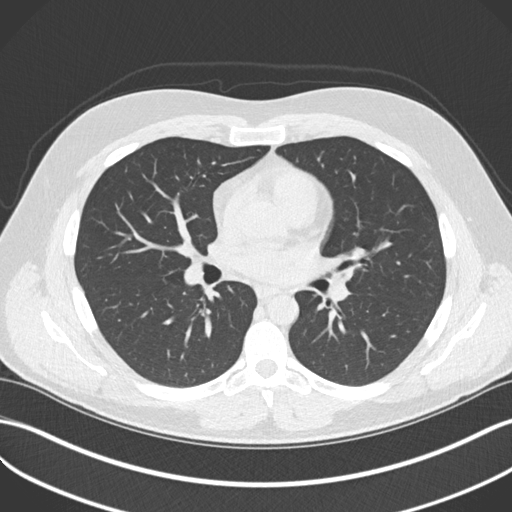
[im 86/159  mediastinal]
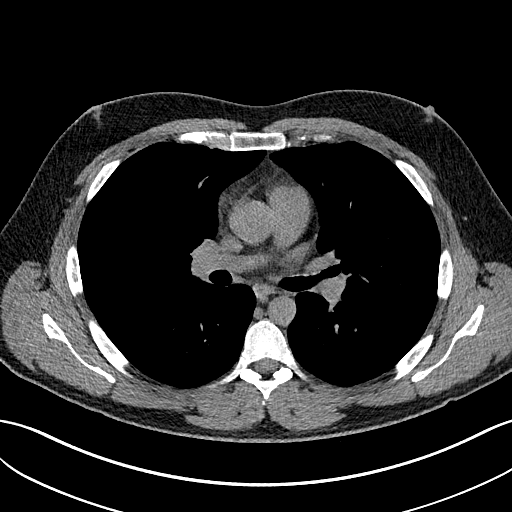
[im 86/159  lung]
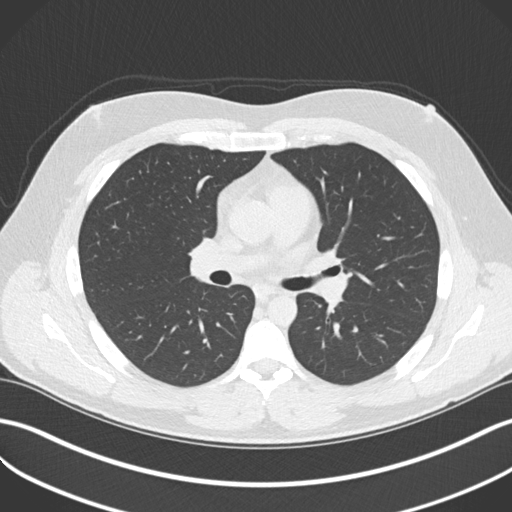
[im 98/159  lung]
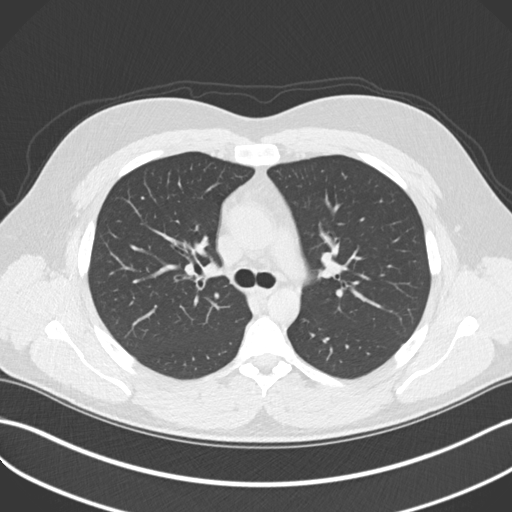
[im 110/159  lung]
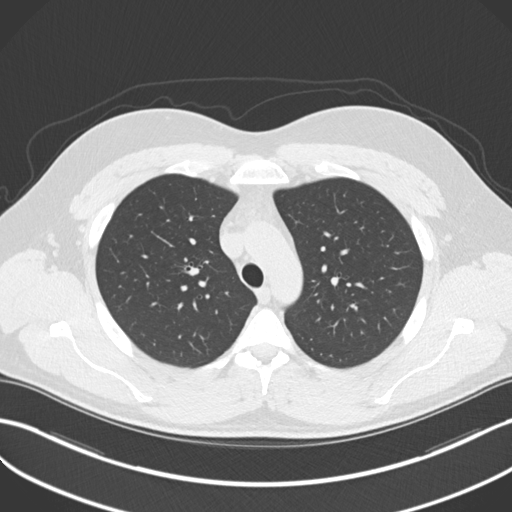
[im 122/159  lung]
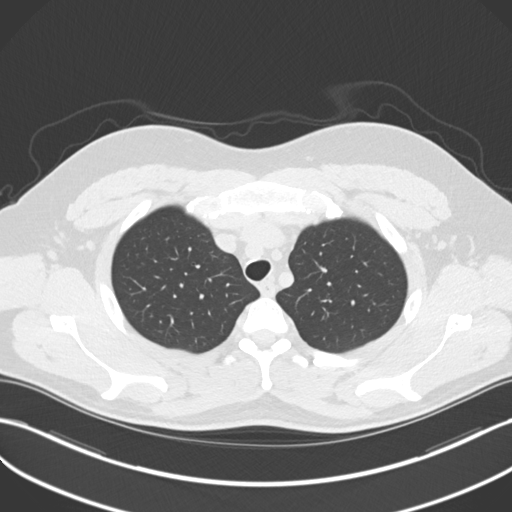
[im 134/159  mediastinal]
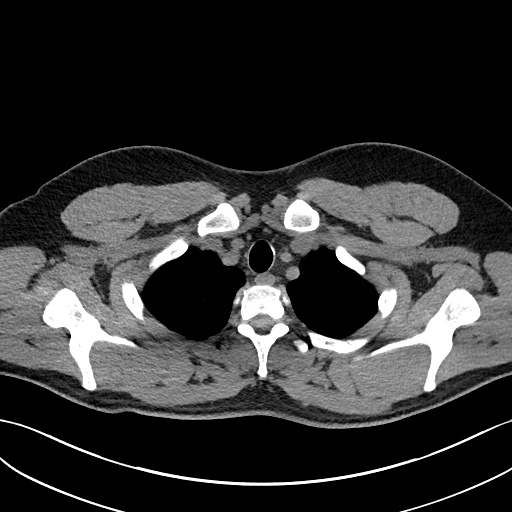
[im 134/159  lung]
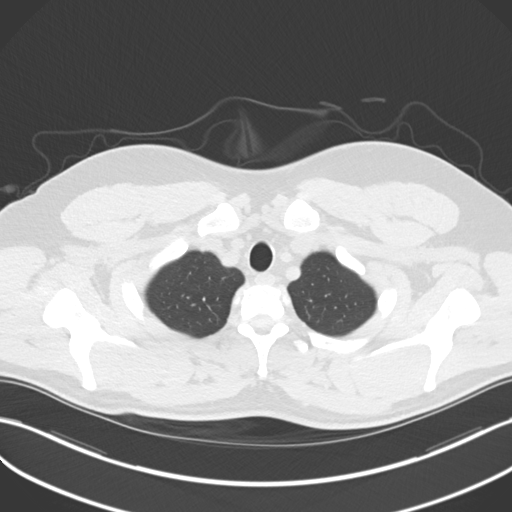
[im 146/159  lung]
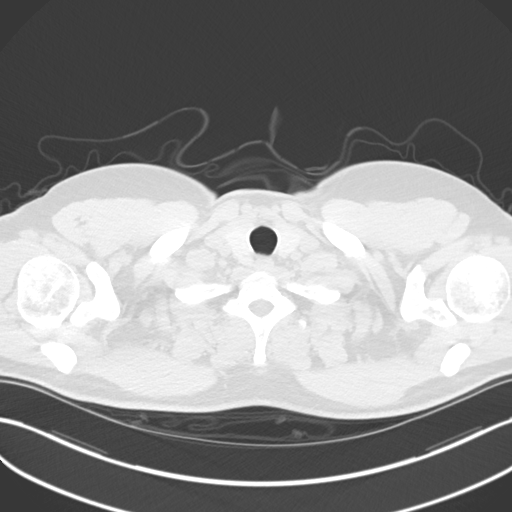

[14 of 32 positions shown; findings below may reference images not displayed]

FINDINGS: Cardiovascular: No significant vascular findings. Normal heart size.
No pericardial effusion.

Mediastinum/Nodes: No enlarged mediastinal, hilar, or axillary lymph
nodes. Thyroid gland, trachea, and esophagus demonstrate no
significant findings.

Lungs/Pleura: Lungs are clear. There is a stable, benign 4 mm
subpleural nodule of the lateral segment right middle lobe (series
4, image 99). No pleural effusion or pneumothorax.

Upper Abdomen: No acute abnormality.

Musculoskeletal: No chest wall mass or suspicious bone lesions
identified.
IMPRESSION: Stable, benign 4 mm subpleural nodule of the lateral segment right
middle lobe. No further routine CT follow-up is required.
